# Patient Record
Sex: Male | Born: 1975 | Race: Black or African American | Hispanic: No | Marital: Single | Smoking: Never smoker
Health system: Southern US, Community
[De-identification: ages and names within clinical notes are randomized; demographics above are authoritative.]

## PROBLEM LIST (undated history)

## (undated) DIAGNOSIS — I10 Essential (primary) hypertension: Secondary | ICD-10-CM

## (undated) DIAGNOSIS — N189 Chronic kidney disease, unspecified: Secondary | ICD-10-CM

## (undated) DIAGNOSIS — E785 Hyperlipidemia, unspecified: Secondary | ICD-10-CM

## (undated) DIAGNOSIS — R112 Nausea with vomiting, unspecified: Secondary | ICD-10-CM

## (undated) DIAGNOSIS — Z9889 Other specified postprocedural states: Secondary | ICD-10-CM

## (undated) DIAGNOSIS — D649 Anemia, unspecified: Secondary | ICD-10-CM

## (undated) DIAGNOSIS — N289 Disorder of kidney and ureter, unspecified: Secondary | ICD-10-CM

## (undated) HISTORY — DX: Chronic kidney disease, unspecified: N18.9

## (undated) HISTORY — PX: NEPHRECTOMY TRANSPLANTED ORGAN: SUR880

## (undated) HISTORY — DX: Anemia, unspecified: D64.9

## (undated) HISTORY — DX: Hyperlipidemia, unspecified: E78.5

## (undated) HISTORY — DX: Essential (primary) hypertension: I10

## (undated) HISTORY — PX: WISDOM TOOTH EXTRACTION: SHX21

---

## 2008-11-12 ENCOUNTER — Encounter: Admission: RE | Admit: 2008-11-12 | Discharge: 2008-11-12 | Payer: Self-pay | Admitting: Nephrology

## 2010-09-09 ENCOUNTER — Encounter (HOSPITAL_COMMUNITY)
Admission: RE | Admit: 2010-09-09 | Discharge: 2010-12-08 | Payer: Self-pay | Source: Home / Self Care | Attending: Nephrology | Admitting: Nephrology

## 2011-12-20 ENCOUNTER — Ambulatory Visit (INDEPENDENT_AMBULATORY_CARE_PROVIDER_SITE_OTHER): Payer: Managed Care, Other (non HMO) | Admitting: Family Medicine

## 2011-12-20 VITALS — BP 112/82 | HR 72 | Temp 97.3°F | Resp 16 | Ht 70.0 in | Wt 205.0 lb

## 2011-12-20 DIAGNOSIS — N289 Disorder of kidney and ureter, unspecified: Secondary | ICD-10-CM

## 2011-12-20 DIAGNOSIS — R1110 Vomiting, unspecified: Secondary | ICD-10-CM

## 2011-12-20 DIAGNOSIS — R55 Syncope and collapse: Secondary | ICD-10-CM

## 2011-12-20 DIAGNOSIS — R197 Diarrhea, unspecified: Secondary | ICD-10-CM

## 2011-12-20 LAB — POCT CBC
Granulocyte percent: 45.8 %G (ref 37–80)
MID (cbc): 0.5 (ref 0–0.9)
MPV: 10.6 fL (ref 0–99.8)
POC LYMPH PERCENT: 42.5 %L (ref 10–50)
POC MID %: 11.7 %M (ref 0–12)
Platelet Count, POC: 187 10*3/uL (ref 142–424)
RBC: 5.02 M/uL (ref 4.69–6.13)
RDW, POC: 14.1 %

## 2011-12-20 LAB — POCT URINALYSIS DIPSTICK
Glucose, UA: NEGATIVE
Ketones, UA: NEGATIVE
Leukocytes, UA: NEGATIVE
Protein, UA: 30
Spec Grav, UA: 1.01
Urobilinogen, UA: 0.2

## 2011-12-20 LAB — BASIC METABOLIC PANEL
Chloride: 105 mEq/L (ref 96–112)
Creat: 6.09 mg/dL — ABNORMAL HIGH (ref 0.50–1.35)
Potassium: 5.2 mEq/L (ref 3.5–5.3)

## 2011-12-20 NOTE — Progress Notes (Signed)
Subjective:    Patient ID: SAYVION VIGEN, male    DOB: Dec 07, 1975, 37 y.o.   MRN: [id number]  HPI PRATIK DALZIEL is a 36 y.o. male with HTN, hyperlipidemia, and renal insufficiency (last Creatinine in the 3 range - Dr. Kathrene Bongo is nephrologist).  C/o 3 days ago - vomiting around 3pm, 4-5 episodes 1st day, cramping abd pain, and diarrhea>10 episodes. Vomiting slowed down - 2 episodes 2 days ago, none yesterday.  Diarrhea has persisted, slowed down. Diarrhea x 3-4 yesterday, x2 today.  Trouble w/ fluids initially, but drinking now.  Normal uop during illness - last episode 2 hrs ago. Lightheaded to syncope 2 days ago - cut on front of face, but no HA.  No prior eval.  Tx: peptobismol.  No fever.  Feeling better today,  No further abd pain, but needs doctors note for oow - 2/2 through today. Georganna Skeans.  Nonsmoker, no IDU, rare etoh - 1-2 drinks per week.    Review of Systems  Constitutional: Positive for appetite change and fatigue. Negative for fever, chills and unexpected weight change.  HENT: Negative for sore throat and trouble swallowing.   Respiratory: Negative for cough, choking and shortness of breath.   Cardiovascular: Negative for chest pain.  Gastrointestinal: Positive for nausea, vomiting, abdominal pain and diarrhea.       Diarrhea is only remaining symptom  Genitourinary: Negative for dysuria, urgency, frequency, hematuria, flank pain, decreased urine volume and difficulty urinating.  Skin: Negative for rash.  Neurological: Positive for dizziness, syncope and light-headedness. Negative for tremors, seizures, weakness and headaches.  Hematological: Does not bruise/bleed easily.       Objective:   Physical Exam  Constitutional: He is oriented to person, place, and time. He appears well-developed and well-nourished.  HENT:  Head: Normocephalic. Head is with abrasion. Head is without raccoon's eyes, without Battle's sign, without contusion, without right periorbital  erythema and without left periorbital erythema.    Right Ear: No hemotympanum.  Left Ear: No hemotympanum.  Nose: No mucosal edema or nasal septal hematoma.  Mouth/Throat: Uvula is midline, oropharynx is clear and moist and mucous membranes are normal.       Small superficial hemostatic abrasion - R eyebrow.  Eyes: Conjunctivae and EOM are normal. Pupils are equal, round, and reactive to light.  Neck: Normal range of motion. Neck supple.  Cardiovascular: Normal rate, regular rhythm, normal heart sounds and intact distal pulses.   No murmur heard. Pulmonary/Chest: Effort normal and breath sounds normal.  Abdominal: Soft. Bowel sounds are normal. He exhibits no distension. There is no hepatosplenomegaly. There is no tenderness. There is no rigidity, no rebound, no guarding and no CVA tenderness.  Neurological: He is alert and oriented to person, place, and time.  Skin: Skin is warm and dry. No rash noted.  Psychiatric: He has a normal mood and affect. His behavior is normal. Judgment and thought content normal.   Creatinines reviewed from nephrology office:  11/24/11: 3.93, 10/26/11: 4.2  Results for orders placed in visit on 12/20/11  POCT CBC      Component Value Range   WBC 4.2 (*) 4.6 - 10.2 (K/uL)   Lymph, poc 1.8  0.6 - 3.4    POC LYMPH PERCENT 42.5  10 - 50 (%L)   MID (cbc) 0.5  0 - 0.9    POC MID % 11.7  0 - 12 (%M)   POC Granulocyte 1.9 (*) 2 - 6.9    Granulocyte percent 45.8  37 -  80 (%G)   RBC 5.02  4.69 - 6.13 (M/uL)   Hemoglobin 13.0 (*) 14.1 - 18.1 (g/dL)   HCT, POC 52.8 (*) 41.3 - 53.7 (%)   MCV 81.8  80 - 97 (fL)   MCH, POC 25.9 (*) 27 - 31.2 (pg)   MCHC 31.6 (*) 31.8 - 35.4 (g/dL)   RDW, POC 24.4     Platelet Count, POC 187  142 - 424 (K/uL)   MPV 10.6  0 - 99.8 (fL)  POCT URINALYSIS DIPSTICK      Component Value Range   Color, UA yellow     Clarity, UA clear     Glucose, UA neg     Bilirubin, UA neg     Ketones, UA neg     Spec Grav, UA 1.010     Blood, UA  trace     pH, UA 5.0     Protein, UA 30     Urobilinogen, UA 0.2     Nitrite, UA neg     Leukocytes, UA Negative          Assessment & Plan:   1. Diarrhea  POCT CBC, Basic metabolic panel, POCT urinalysis dipstick  2. Vomiting  POCT CBC, Basic metabolic panel, POCT urinalysis dipstick  3. Kidney disease  Basic metabolic panel   Likely viral gastroenteritis, now improved.  And tolerating po fluids.  Initial vital signs repeated are stable. Suspect syncopal episode 2 days ago d/t combo of volume depletion adn antihypertensives.  Underlying CKD - at risk for worsening with pre renal azotemia - stat BMP sent tonight.  As he feels ok now, denies recent dizziness, and BP ok, can try to RTW with orthostatic precautions, and avoid working on elevated surfaces.  Push fluids, and check BP 2x/day.  If any worsening of sx's, change in urination, or return of abd pain/vomiting - Return to the clinic or go to the nearest emergency room.  Pt's cell phone for stat labs:  [phone]

## 2011-12-20 NOTE — Patient Instructions (Addendum)
Push fluids, and check Blood Pressures 2x/day.  If any worsening of symptoms, change in urination, or return of abdominal pain or vomiting - Return to the clinic or go to the nearest emergency room.  Do not work on elevated surfaces tonight, and if any dizziness, stop working and return to clinic.  If diarrhea not resolving in next 2-3 days, return to clinic.

## 2011-12-24 ENCOUNTER — Telehealth: Payer: Self-pay

## 2011-12-24 NOTE — Telephone Encounter (Signed)
NORA @ [facility] WANTS A COPY OF DR GREENE'S NOTES RU:[id number] IN BP MEDICATION FOR PT.  PLEASE FAX TO [phone]

## 2011-12-27 ENCOUNTER — Telehealth: Payer: Self-pay

## 2012-01-12 ENCOUNTER — Telehealth: Payer: Self-pay

## 2012-01-12 NOTE — Telephone Encounter (Signed)
.  [facility] Patient called regarding medication issue.  Patient states Dr. Neva Seat discontinued one of his blood pressure medications and now his blood pressure is elevated. Patient would like to know if he needs to restart that medication or if this is a normal side effect and will stop with time. Please call patient with instructions. Patient phone number is [phone].

## 2012-01-14 NOTE — Telephone Encounter (Signed)
LMOM to CB. 

## 2012-01-15 NOTE — Telephone Encounter (Signed)
LMOM TO CB 

## 2012-01-16 NOTE — Telephone Encounter (Signed)
LMOM TO CB 

## 2012-01-17 NOTE — Telephone Encounter (Signed)
Pt reports that his BP is now good again. He had gone to his nephrologist who had changed his medications around a little and everything is fine now.

## 2012-02-18 NOTE — Telephone Encounter (Signed)
Please see note from 2/27 for details.  Ryan

## 2012-08-10 ENCOUNTER — Ambulatory Visit (INDEPENDENT_AMBULATORY_CARE_PROVIDER_SITE_OTHER): Payer: Managed Care, Other (non HMO) | Admitting: Family Medicine

## 2012-08-10 VITALS — BP 130/89 | HR 63 | Temp 97.3°F | Resp 18 | Ht 70.0 in | Wt 208.0 lb

## 2012-08-10 DIAGNOSIS — Z0000 Encounter for general adult medical examination without abnormal findings: Secondary | ICD-10-CM

## 2012-08-10 NOTE — Progress Notes (Signed)
[facility]:  Office Visit  Chief Complaint:  Chief Complaint  Patient presents with  . Employment Physical    biometric review and paperwork    HPI: Lawrence Mckenzie is a 36 y.o. male who complains of  PE for work. He already had lipids and glucose done at work on 03/31/12 and they were all normal. He declines blood work today. H/o HTN and CKD with baseline creatinine around 6. Patient on transplant list. CKD due to HTN he says.   Past Medical History  Diagnosis Date  . Chronic kidney disease   . Hypertension   . Hyperlipidemia    History reviewed. No pertinent past surgical history. History   Social History  . Marital Status: Single    Spouse Name: N/A    Number of Children: N/A  . Years of Education: N/A   Social History Main Topics  . Smoking status: Never Smoker   . Smokeless tobacco: None  . Alcohol Use: Yes  . Drug Use: No  . Sexually Active: None   Other Topics Concern  . None   Social History Narrative  . None   Family History  Problem Relation Age of Onset  . Hypertension Mother   . Hypertension Father    No Known Allergies Prior to Admission medications   Medication Sig Start Date End Date Taking? Authorizing Provider  amLODipine (NORVASC) 5 MG tablet Take 5 mg by mouth daily.   Yes Historical Provider, MD  Choline Fenofibrate (TRILIPIX) 135 MG capsule Take 135 mg by mouth daily.   Yes Historical Provider, MD  lisinopril-hydrochlorothiazide (PRINZIDE,ZESTORETIC) 20-12.5 MG per tablet Take 1 tablet by mouth daily.   Yes Historical Provider, MD  metoprolol succinate (TOPROL-XL) 50 MG 24 hr tablet Take 50 mg by mouth daily. Take with or immediately following a meal.   Yes Historical Provider, MD     ROS: The patient denies fevers, chills, night sweats, unintentional weight loss, chest pain, palpitations, wheezing, dyspnea on exertion, nausea, vomiting, abdominal pain, dysuria, hematuria, melena, numbness, weakness, or tingling.    All other systems have been reviewed and were otherwise negative with the exception of those mentioned in the HPI and as above.    PHYSICAL EXAM: Filed Vitals:   08/10/12 1653  BP: 130/89  Pulse: 63  Temp: 97.3 F (36.3 C)  Resp: 18   Filed Vitals:   08/10/12 1653  Height: 5\' 10"  (1.778 m)  Weight: 208 lb (94.348 kg)   Body mass index is 29.84 kg/(m^2).  General: Alert, no acute distress HEENT:  Normocephalic, atraumatic, oropharynx patent. Fundoscopic exam grossly nl. Cardiovascular:  Regular rate and rhythm, no rubs murmurs or gallops.  No Carotid bruits, radial pulse intact. No pedal edema.  Respiratory: Clear to auscultation bilaterally.  No wheezes, rales, or rhonchi.  No cyanosis, no use of accessory musculature GI: No organomegaly, abdomen is soft and non-tender, positive bowel sounds.  No masses. Skin: No rashes. Neurologic: Facial musculature symmetric. CN 2-12 grossly nl Psychiatric: Patient is appropriate throughout our interaction. Lymphatic: No cervical lymphadenopathy Musculoskeletal: Gait intact.   LABS: Results for orders placed in visit on 12/20/11  POCT CBC      Component Value Range   WBC 4.2 (*) 4.6 - 10.2 K/uL   Lymph, poc 1.8  0.6 - 3.4   POC LYMPH PERCENT 42.5  10 - 50 %L   MID (cbc) 0.5  0 - 0.9   POC MID % 11.7  0 - 12 %M  POC Granulocyte 1.9 (*) 2 - 6.9   Granulocyte percent 45.8  37 - 80 %G   RBC 5.02  4.69 - 6.13 M/uL   Hemoglobin 13.0 (*) 14.1 - 18.1 g/dL   HCT, POC 82.9 (*) 56.2 - 53.7 %   MCV 81.8  80 - 97 fL   MCH, POC 25.9 (*) 27 - 31.2 pg   MCHC 31.6 (*) 31.8 - 35.4 g/dL   RDW, POC 13.0     Platelet Count, POC 187  142 - 424 K/uL   MPV 10.6  0 - 99.8 fL  BASIC METABOLIC PANEL      Component Value Range   Sodium 135  135 - 145 mEq/L   Potassium 5.2  3.5 - 5.3 mEq/L   Chloride 105  96 - 112 mEq/L   CO2 21  19 - 32 mEq/L   Glucose, Bld 92  70 - 99 mg/dL   BUN 77 (*) 6 - 23 mg/dL   Creat 8.65 (*) 7.84 - 1.35 mg/dL   Calcium  9.4  8.4 - 69.6 mg/dL  POCT URINALYSIS DIPSTICK      Component Value Range   Color, UA yellow     Clarity, UA clear     Glucose, UA neg     Bilirubin, UA neg     Ketones, UA neg     Spec Grav, UA 1.010     Blood, UA trace     pH, UA 5.0     Protein, UA 30     Urobilinogen, UA 0.2     Nitrite, UA neg     Leukocytes, UA Negative       EKG/XRAY:   Primary read interpreted by Dr. Conley Rolls at [facility].   ASSESSMENT/PLAN: Encounter Diagnosis  Name Primary?  . PE (physical exam), annual Yes   Forms filled out F/u prn    LE, THAO PHUONG, DO 08/10/2012 5:34 PM

## 2012-11-29 ENCOUNTER — Other Ambulatory Visit (HOSPITAL_COMMUNITY): Payer: Self-pay | Admitting: *Deleted

## 2012-11-30 ENCOUNTER — Encounter (HOSPITAL_COMMUNITY): Payer: Self-pay

## 2012-12-07 ENCOUNTER — Encounter (HOSPITAL_COMMUNITY): Payer: Self-pay

## 2013-01-10 ENCOUNTER — Encounter (HOSPITAL_COMMUNITY): Payer: Self-pay

## 2013-01-12 ENCOUNTER — Telehealth: Payer: Self-pay | Admitting: Radiology

## 2013-01-12 NOTE — Telephone Encounter (Signed)
Patient has had IV infusions of Iron. He wanted this done through our office if possible to save money. He is advised we do not do iron infusions. He will continue to try to find a provider who can do this for him.

## 2013-01-16 ENCOUNTER — Encounter (HOSPITAL_COMMUNITY): Payer: Self-pay

## 2013-02-28 ENCOUNTER — Encounter: Payer: Self-pay | Admitting: Family Medicine

## 2013-02-28 DIAGNOSIS — R809 Proteinuria, unspecified: Secondary | ICD-10-CM | POA: Insufficient documentation

## 2013-02-28 DIAGNOSIS — I10 Essential (primary) hypertension: Secondary | ICD-10-CM

## 2013-02-28 DIAGNOSIS — R319 Hematuria, unspecified: Secondary | ICD-10-CM | POA: Insufficient documentation

## 2013-06-29 ENCOUNTER — Ambulatory Visit (INDEPENDENT_AMBULATORY_CARE_PROVIDER_SITE_OTHER): Payer: Managed Care, Other (non HMO) | Admitting: Internal Medicine

## 2013-06-29 VITALS — BP 122/84 | HR 57 | Temp 98.7°F | Resp 18 | Ht 69.5 in | Wt 198.0 lb

## 2013-06-29 DIAGNOSIS — K529 Noninfective gastroenteritis and colitis, unspecified: Secondary | ICD-10-CM

## 2013-06-29 DIAGNOSIS — K5289 Other specified noninfective gastroenteritis and colitis: Secondary | ICD-10-CM

## 2013-06-29 NOTE — Patient Instructions (Addendum)
Viral Gastroenteritis Viral gastroenteritis is also known as stomach flu. This condition affects the stomach and intestinal tract. It can cause sudden diarrhea and vomiting. The illness typically lasts 3 to 8 days. Most people develop an immune response that eventually gets rid of the virus. While this natural response develops, the virus can make you quite ill. CAUSES  Many different viruses can cause gastroenteritis, such as rotavirus or noroviruses. You can catch one of these viruses by consuming contaminated food or water. You may also catch a virus by sharing utensils or other personal items with an infected person or by touching a contaminated surface. SYMPTOMS  The most common symptoms are diarrhea and vomiting. These problems can cause a severe loss of body fluids (dehydration) and a body salt (electrolyte) imbalance. Other symptoms may include:  Fever.  Headache.  Fatigue.  Abdominal pain. DIAGNOSIS  Your caregiver can usually diagnose viral gastroenteritis based on your symptoms and a physical exam. A stool sample may also be taken to test for the presence of viruses or other infections. TREATMENT  This illness typically goes away on its own. Treatments are aimed at rehydration. The most serious cases of viral gastroenteritis involve vomiting so severely that you are not able to keep fluids down. In these cases, fluids must be given through an intravenous line (IV). HOME CARE INSTRUCTIONS   Drink enough fluids to keep your urine clear or pale yellow. Drink small amounts of fluids frequently and increase the amounts as tolerated.  Ask your caregiver for specific rehydration instructions.  Avoid:  Foods high in sugar.  Alcohol.  Carbonated drinks.  Tobacco.  Juice.  Caffeine drinks.  Extremely hot or cold fluids.  Fatty, greasy foods.  Too much intake of anything at one time.  Dairy products until 24 to 48 hours after diarrhea stops.  You may consume probiotics.  Probiotics are active cultures of beneficial bacteria. They may lessen the amount and number of diarrheal stools in adults. Probiotics can be found in yogurt with active cultures and in supplements.  Wash your hands well to avoid spreading the virus.  Only take over-the-counter or prescription medicines for pain, discomfort, or fever as directed by your caregiver. Do not give aspirin to children. Antidiarrheal medicines are not recommended.  Ask your caregiver if you should continue to take your regular prescribed and over-the-counter medicines.  Keep all follow-up appointments as directed by your caregiver. SEEK IMMEDIATE MEDICAL CARE IF:   You are unable to keep fluids down.  You do not urinate at least once every 6 to 8 hours.  You develop shortness of breath.  You notice blood in your stool or vomit. This may look like coffee grounds.  You have abdominal pain that increases or is concentrated in one small area (localized).  You have persistent vomiting or diarrhea.  You have a fever.  The patient is a child younger than 3 months, and he or she has a fever.  The patient is a child older than 3 months, and he or she has a fever and persistent symptoms.  The patient is a child older than 3 months, and he or she has a fever and symptoms suddenly get worse.  The patient is a baby, and he or she has no tears when crying. MAKE SURE YOU:   Understand these instructions.  Will watch your condition.  Will get help right away if you are not doing well or get worse. Document Released: 11/01/2005 Document Revised: 01/24/2012 Document Reviewed: 08/18/2011   Exit[employer] Patient Information 2014 [employer]  

## 2013-06-29 NOTE — Progress Notes (Signed)
  Subjective:    Patient ID: Lawrence Mckenzie, male    DOB: 05-17-1976, 37 y.o.   MRN: [id number]  HPI Diarrhea improving No fever, vomiting,or abdominal pain.  Review of Systems     Objective:   Physical Exam  Vitals reviewed. Constitutional: He is oriented to person, place, and time. He appears well-developed and well-nourished. No distress.  HENT:  Right Ear: External ear normal.  Left Ear: External ear normal.  Nose: Nose normal.  Mouth/Throat: Oropharynx is clear and moist.  Eyes: EOM are normal. No scleral icterus.  Neck: Neck supple.  Cardiovascular: Normal rate.   Pulmonary/Chest: Effort normal.  Abdominal: Soft. There is no tenderness.  Musculoskeletal: Normal range of motion.  Neurological: He is alert and oriented to person, place, and time. He exhibits normal muscle tone. Coordination normal.          Assessment & Plan:  Gastroenteritis Diet/Hydrate/Rest

## 2013-07-20 ENCOUNTER — Other Ambulatory Visit (HOSPITAL_COMMUNITY): Payer: Self-pay

## 2013-07-23 ENCOUNTER — Encounter (HOSPITAL_COMMUNITY)
Admission: RE | Admit: 2013-07-23 | Discharge: 2013-07-23 | Disposition: A | Discharge status: Home / Self Care | Payer: Managed Care, Other (non HMO) | Source: Ambulatory Visit | Attending: Nephrology | Admitting: Nephrology

## 2013-07-23 DIAGNOSIS — D649 Anemia, unspecified: Secondary | ICD-10-CM | POA: Insufficient documentation

## 2013-07-23 LAB — POCT HEMOGLOBIN-HEMACUE: Hemoglobin: 9.3 g/dL — ABNORMAL LOW (ref 13.0–17.0)

## 2013-07-23 MED ORDER — EPOETIN ALFA 20000 UNIT/ML IJ SOLN: 20000.0000 [IU] | INTRAMUSCULAR | Status: DC

## 2013-07-23 MED ORDER — EPOETIN ALFA 20000 UNIT/ML IJ SOLN
INTRAMUSCULAR | Status: AC
  Administered 2013-07-23: 20000 [IU]
  Filled 2013-07-23: qty 1

## 2013-07-23 MED ORDER — SODIUM CHLORIDE 0.9 % IV SOLN
1020.0000 mg | Freq: Once | INTRAVENOUS | Status: AC
  Administered 2013-07-23: 1020 mg via INTRAVENOUS
  Filled 2013-07-23: qty 34

## 2013-08-06 ENCOUNTER — Encounter (HOSPITAL_COMMUNITY)
Admission: RE | Admit: 2013-08-06 | Discharge: 2013-08-06 | Disposition: A | Discharge status: Home / Self Care | Payer: Managed Care, Other (non HMO) | Source: Ambulatory Visit | Attending: Nephrology | Admitting: Nephrology

## 2013-08-06 LAB — POCT HEMOGLOBIN-HEMACUE: Hemoglobin: 10.5 g/dL — ABNORMAL LOW (ref 13.0–17.0)

## 2013-08-06 MED ORDER — EPOETIN ALFA 20000 UNIT/ML IJ SOLN
INTRAMUSCULAR | Status: AC
  Filled 2013-08-06: qty 1

## 2013-08-06 MED ORDER — EPOETIN ALFA 20000 UNIT/ML IJ SOLN
20000.0000 [IU] | INTRAMUSCULAR | Status: DC
  Administered 2013-08-06: 20000 [IU] via SUBCUTANEOUS

## 2013-08-12 ENCOUNTER — Ambulatory Visit (INDEPENDENT_AMBULATORY_CARE_PROVIDER_SITE_OTHER): Payer: Managed Care, Other (non HMO) | Admitting: Emergency Medicine

## 2013-08-12 VITALS — BP 138/88 | HR 82 | Temp 98.2°F | Resp 18 | Ht 69.25 in | Wt 196.4 lb

## 2013-08-12 DIAGNOSIS — E785 Hyperlipidemia, unspecified: Secondary | ICD-10-CM

## 2013-08-12 DIAGNOSIS — I10 Essential (primary) hypertension: Secondary | ICD-10-CM

## 2013-08-12 DIAGNOSIS — Z0000 Encounter for general adult medical examination without abnormal findings: Secondary | ICD-10-CM

## 2013-08-12 DIAGNOSIS — N289 Disorder of kidney and ureter, unspecified: Secondary | ICD-10-CM

## 2013-08-12 LAB — POCT UA - MICROSCOPIC ONLY
Bacteria, U Microscopic: NEGATIVE
Crystals, Ur, HPF, POC: NEGATIVE
Epithelial cells, urine per micros: NEGATIVE
Mucus, UA: NEGATIVE
RBC, urine, microscopic: NEGATIVE
WBC, Ur, HPF, POC: NEGATIVE

## 2013-08-12 LAB — POCT CBC
Granulocyte percent: 50.1 %G (ref 37–80)
HCT, POC: 38.1 % — AB (ref 43.5–53.7)
Hemoglobin: 11.6 g/dL — AB (ref 14.1–18.1)
MCV: 89.4 fL (ref 80–97)
POC Granulocyte: 2.7 (ref 2–6.9)
POC LYMPH PERCENT: 41.7 %L (ref 10–50)
RBC: 4.26 M/uL — AB (ref 4.69–6.13)
RDW, POC: 15.8 %

## 2013-08-12 LAB — LIPID PANEL
HDL: 45 mg/dL (ref >39)
Total CHOL/HDL Ratio: 2.8 Ratio
VLDL: 20 mg/dL (ref 0–40)

## 2013-08-12 LAB — POCT URINALYSIS DIPSTICK
Bilirubin, UA: NEGATIVE
Blood, UA: NEGATIVE
Ketones, UA: NEGATIVE
Leukocytes, UA: NEGATIVE
Protein, UA: 30
Spec Grav, UA: 1.01
pH, UA: 5.5

## 2013-08-12 LAB — COMPREHENSIVE METABOLIC PANEL
AST: 26 U/L (ref 0–37)
BUN: 92 mg/dL — ABNORMAL HIGH (ref 6–23)
Calcium: 9 mg/dL (ref 8.4–10.5)
Chloride: 102 mEq/L (ref 96–112)
Creat: 8.13 mg/dL — ABNORMAL HIGH (ref 0.50–1.35)
Total Bilirubin: 0.4 mg/dL (ref 0.3–1.2)

## 2013-08-12 LAB — TSH: TSH: 1.03 u[IU]/mL (ref 0.350–4.500)

## 2013-08-12 NOTE — Patient Instructions (Signed)

## 2013-08-12 NOTE — Progress Notes (Signed)
[facility] [address] [place] [address] [phone]  Date:  08/12/2013   Name:  Lawrence Mckenzie   DOB:  08/02/1976   MRN:  [id number]  PCP:  No PCP Per Patient    Chief Complaint: Annual Exam   History of Present Illness:  Lawrence Mckenzie is a 37 y.o. very pleasant male patient who presents with the following:  History of hypertension and hyperlipidemia and chronic renal insufficiency.  To office for wellness physical.  Non smoker.    Patient Active Problem List   Diagnosis Date Noted  . 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 Proteinuria 02/28/2013  . Hematuria 02/28/2013    Past Medical History  Diagnosis Date  . Chronic kidney disease   . Hypertension   . Hyperlipidemia     History reviewed. No pertinent past surgical history.  History  Substance Use Topics  . Smoking status: Never Smoker   . Smokeless tobacco: Not on file  . Alcohol Use: No    Family History  Problem Relation Age of Onset  . Hypertension Mother   . Hypertension Father     No Known Allergies  Medication list has been reviewed and updated.  Current Outpatient Prescriptions on File Prior to Visit  Medication Sig Dispense Refill  . amLODipine (NORVASC) 5 MG tablet Take 5 mg by mouth daily.      . Choline Fenofibrate (TRILIPIX) 135 MG capsule Take 135 mg by mouth daily.      . metoprolol succinate (TOPROL-XL) 50 MG 24 hr tablet Take 50 mg by mouth daily. Take with or immediately following a meal.       No current facility-administered medications on file prior to visit.    Review of Systems:  As per HPI, otherwise negative.    Physical Examination: Filed Vitals:   08/12/13 1446  BP: 138/88  Pulse: 82  Temp: 98.2 F (36.8 C)  Resp: 18   Filed Vitals:   08/12/13 1446  Height: 5' 9.25" (1.759 m)  Weight: 196 lb 6.4 oz (89.086 kg)   Body mass index is 28.79 kg/(m^2). Ideal Body Weight: Weight in (lb) to have BMI = 25: 170.2   GEN: WDWN, NAD,  Non-toxic, A & O x 3 HEENT: Atraumatic, Normocephalic. Neck supple. No masses, No LAD. Ears and Nose: No external deformity. CV: RRR, No M/G/R. No JVD. No thrill. No extra heart sounds. PULM: CTA B, no wheezes, crackles, rhonchi. No retractions. No resp. distress. No accessory muscle use. ABD: S, NT, ND, +BS. No rebound. No HSM. EXTR: No c/c/e NEURO Normal gait.  PSYCH: Normally interactive. Conversant. Not depressed or anxious appearing.  Calm demeanor.    Assessment and Plan: Hypertension Hyperlipidemia Chronic renal insufficiency Labs  Signed,  Phillips Odor, MD

## 2013-08-13 ENCOUNTER — Telehealth: Payer: Self-pay | Admitting: Family Medicine

## 2013-08-13 LAB — VITAMIN D 25 HYDROXY (VIT D DEFICIENCY, FRACTURES): Vit D, 25-Hydroxy: 14 ng/mL — ABNORMAL LOW (ref 30–89)

## 2013-08-13 NOTE — Telephone Encounter (Addendum)
Received critical lab from [facility]- creat over 8. Called pt-apparently this is his baseline. [phone] Dr. Kathrene Bongo is his nephrologist.  Faxed labs to her- when we called their office they reported that pt is actually there right now.

## 2013-08-14 ENCOUNTER — Telehealth: Payer: Self-pay

## 2013-08-14 MED ORDER — VITAMIN D (ERGOCALCIFEROL) 1.25 MG (50000 UNIT) PO CAPS: 50000.0000 [IU] | ORAL_CAPSULE | ORAL | Status: DC

## 2013-08-14 NOTE — Telephone Encounter (Signed)
Called- he is doing well, sees his nephrologist once a month.  He was at her office yesterday

## 2013-08-14 NOTE — Addendum Note (Signed)
Addended by: Carmelina Dane on: 08/14/2013 08:53 AM   Modules accepted: Orders

## 2013-08-14 NOTE — Telephone Encounter (Signed)
Pt is returning the labs call

## 2013-08-20 ENCOUNTER — Encounter (HOSPITAL_COMMUNITY)
Admission: RE | Admit: 2013-08-20 | Discharge: 2013-08-20 | Disposition: A | Discharge status: Home / Self Care | Payer: Managed Care, Other (non HMO) | Source: Ambulatory Visit | Attending: Nephrology | Admitting: Nephrology

## 2013-08-20 DIAGNOSIS — D649 Anemia, unspecified: Secondary | ICD-10-CM | POA: Insufficient documentation

## 2013-08-20 LAB — IRON AND TIBC
Iron: 172 ug/dL — ABNORMAL HIGH (ref 42–135)
TIBC: 351 ug/dL (ref 215–435)

## 2013-08-20 LAB — FERRITIN: Ferritin: 1334 ng/mL — ABNORMAL HIGH (ref 22–322)

## 2013-08-20 LAB — POCT HEMOGLOBIN-HEMACUE: Hemoglobin: 10.8 g/dL — ABNORMAL LOW (ref 13.0–17.0)

## 2013-08-20 MED ORDER — EPOETIN ALFA 20000 UNIT/ML IJ SOLN
INTRAMUSCULAR | Status: AC
  Filled 2013-08-20: qty 1

## 2013-08-20 MED ORDER — EPOETIN ALFA 20000 UNIT/ML IJ SOLN
20000.0000 [IU] | INTRAMUSCULAR | Status: DC
  Administered 2013-08-20: 20000 [IU] via SUBCUTANEOUS

## 2013-08-30 ENCOUNTER — Encounter: Payer: Self-pay | Admitting: Family Medicine

## 2013-08-30 DIAGNOSIS — N184 Chronic kidney disease, stage 4 (severe): Secondary | ICD-10-CM | POA: Insufficient documentation

## 2013-09-03 ENCOUNTER — Encounter (HOSPITAL_COMMUNITY): Payer: Managed Care, Other (non HMO)

## 2013-09-14 ENCOUNTER — Other Ambulatory Visit (HOSPITAL_COMMUNITY): Payer: Self-pay | Admitting: *Deleted

## 2013-09-17 ENCOUNTER — Encounter (HOSPITAL_COMMUNITY)
Admission: RE | Admit: 2013-09-17 | Discharge: 2013-09-17 | Disposition: A | Discharge status: Home / Self Care | Payer: Managed Care, Other (non HMO) | Source: Ambulatory Visit | Attending: Nephrology | Admitting: Nephrology

## 2013-09-17 DIAGNOSIS — D649 Anemia, unspecified: Secondary | ICD-10-CM | POA: Insufficient documentation

## 2013-09-17 LAB — IRON AND TIBC
Saturation Ratios: 50 % (ref 20–55)
TIBC: 353 ug/dL (ref 215–435)
UIBC: 177 ug/dL (ref 125–400)

## 2013-09-17 MED ORDER — EPOETIN ALFA 20000 UNIT/ML IJ SOLN
INTRAMUSCULAR | Status: AC
  Filled 2013-09-17: qty 1

## 2013-09-17 MED ORDER — EPOETIN ALFA 20000 UNIT/ML IJ SOLN
20000.0000 [IU] | INTRAMUSCULAR | Status: DC
  Administered 2013-09-17: 20000 [IU] via SUBCUTANEOUS

## 2013-10-02 ENCOUNTER — Ambulatory Visit (INDEPENDENT_AMBULATORY_CARE_PROVIDER_SITE_OTHER): Payer: Managed Care, Other (non HMO) | Admitting: Family Medicine

## 2013-10-02 VITALS — BP 118/80 | HR 65 | Temp 98.3°F | Resp 18 | Wt 205.0 lb

## 2013-10-02 DIAGNOSIS — K5289 Other specified noninfective gastroenteritis and colitis: Secondary | ICD-10-CM

## 2013-10-02 DIAGNOSIS — K529 Noninfective gastroenteritis and colitis, unspecified: Secondary | ICD-10-CM

## 2013-10-02 NOTE — Progress Notes (Signed)
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 He's had no blood in the stool and has minimal cramping. He's missed work for 2 days and needs a work note. He's had some cough but the cough is almost resolved at this point. He's had no fever or significant abdominal pain.  Objective: Patient seen with his wife and 41-month-old son. He is in no acute distress. HEENT: Unremarkable Abdomen: Soft nontender without HSM. Bowel sounds normal Chest: Clear Heart: Regular no murmur, normal rate Extremities: No jaundice, normal gait, no edema  Assessment: Viral gastroenteritis, hemodynamically stable  Plan: Probiotics, return to work tomorrow  No diagnosis found.  Signed, Elvina Sidle, MD

## 2013-10-15 ENCOUNTER — Encounter (HOSPITAL_COMMUNITY)
Admission: RE | Admit: 2013-10-15 | Discharge: 2013-10-15 | Disposition: A | Discharge status: Home / Self Care | Payer: Managed Care, Other (non HMO) | Source: Ambulatory Visit | Attending: Nephrology | Admitting: Nephrology

## 2013-10-15 DIAGNOSIS — D649 Anemia, unspecified: Secondary | ICD-10-CM | POA: Insufficient documentation

## 2013-10-15 LAB — FERRITIN: Ferritin: 783 ng/mL — ABNORMAL HIGH (ref 22–322)

## 2013-10-15 LAB — IRON AND TIBC
Iron: 159 ug/dL — ABNORMAL HIGH (ref 42–135)
Saturation Ratios: 47 % (ref 20–55)
UIBC: 180 ug/dL (ref 125–400)

## 2013-10-15 LAB — POCT HEMOGLOBIN-HEMACUE: Hemoglobin: 10.6 g/dL — ABNORMAL LOW (ref 13.0–17.0)

## 2013-10-15 MED ORDER — EPOETIN ALFA 20000 UNIT/ML IJ SOLN
20000.0000 [IU] | INTRAMUSCULAR | Status: DC
  Administered 2013-10-15: 20000 [IU] via SUBCUTANEOUS

## 2013-10-15 MED ORDER — EPOETIN ALFA 20000 UNIT/ML IJ SOLN
INTRAMUSCULAR | Status: AC
  Filled 2013-10-15: qty 1

## 2013-11-12 ENCOUNTER — Encounter (HOSPITAL_COMMUNITY)
Admission: RE | Admit: 2013-11-12 | Discharge: 2013-11-12 | Disposition: A | Discharge status: Home / Self Care | Payer: Managed Care, Other (non HMO) | Source: Ambulatory Visit | Attending: Nephrology | Admitting: Nephrology

## 2013-11-12 LAB — POCT HEMOGLOBIN-HEMACUE: Hemoglobin: 10.3 g/dL — ABNORMAL LOW (ref 13.0–17.0)

## 2013-11-12 MED ORDER — EPOETIN ALFA 20000 UNIT/ML IJ SOLN
20000.0000 [IU] | INTRAMUSCULAR | Status: DC
  Administered 2013-11-12: 20000 [IU] via SUBCUTANEOUS

## 2013-11-12 MED ORDER — EPOETIN ALFA 20000 UNIT/ML IJ SOLN
INTRAMUSCULAR | Status: AC
  Administered 2013-11-12: 20000 [IU] via SUBCUTANEOUS
  Filled 2013-11-12: qty 1

## 2013-12-07 ENCOUNTER — Other Ambulatory Visit (HOSPITAL_COMMUNITY): Payer: Self-pay

## 2013-12-10 ENCOUNTER — Encounter (HOSPITAL_COMMUNITY): Payer: Managed Care, Other (non HMO)

## 2013-12-11 ENCOUNTER — Ambulatory Visit (INDEPENDENT_AMBULATORY_CARE_PROVIDER_SITE_OTHER): Payer: Managed Care, Other (non HMO) | Admitting: General Surgery

## 2013-12-11 ENCOUNTER — Encounter (INDEPENDENT_AMBULATORY_CARE_PROVIDER_SITE_OTHER): Payer: Self-pay | Admitting: General Surgery

## 2013-12-11 VITALS — BP 118/74 | HR 86 | Resp 16 | Ht 70.0 in | Wt 210.0 lb

## 2013-12-11 DIAGNOSIS — N189 Chronic kidney disease, unspecified: Secondary | ICD-10-CM

## 2013-12-11 NOTE — Progress Notes (Signed)
Patient ID: Lawrence LeaMichael Kolek, male   DOB: 06-28-1976, 38 y.o.   MRN: [id number]  Chief Complaint  Patient presents with  . Other    Eval PD Cath    HPI Lawrence Mckenzie is a 38 y.o. male.  The patient is a 38 year old male who is referred by Dr. Kathrene BongoG[place] for an evaluation for a PD catheter placement. Patient has a history of chronic renal failure of unknown etiology. Patient does have a history of hypertension. Patient is currently not on dialysis at this time but according to Dr. Kathrene BongoG[place] is close to needing dialysis at this time. The patient is on a kidney transplant list and has been on it for about 2-4 years. Patient comes in today for initial consultation for a PD catheter placement. HPI  Past Medical History  Diagnosis Date  . Chronic kidney disease   . Hypertension   . Hyperlipidemia   . Anemia     History reviewed. No pertinent past surgical history.  Family History  Problem Relation Age of Onset  . Hypertension Mother   . Hypertension Father     Social History History  Substance Use Topics  . Smoking status: Never Smoker   . Smokeless tobacco: Not on file  . Alcohol Use: No    No Known Allergies  Current Outpatient Prescriptions  Medication Sig Dispense Refill  . amLODipine (NORVASC) 5 MG tablet Take 5 mg by mouth daily.      . Choline Fenofibrate (TRILIPIX) 135 MG capsule Take 135 mg by mouth daily.      . metoprolol succinate (TOPROL-XL) 50 MG 24 hr tablet Take 50 mg by mouth daily. Take with or immediately following a meal.      . Vitamin D, Ergocalciferol, (DRISDOL) 50000 UNITS CAPS capsule Take 1 capsule (50,000 Units total) by mouth every 7 (seven) days.  30 capsule  4   No current facility-administered medications for this visit.    Review of Systems Review of Systems  Constitutional: Negative.   HENT: Negative.   Eyes: Negative.   Respiratory: Negative.   Cardiovascular: Negative.   Gastrointestinal: Negative.   Endocrine: Negative.    Neurological: Negative.     Blood pressure 118/74, pulse 86, resp. rate 16, height 5\' 10"  (1.778 m), weight 210 lb (95.255 kg).  Physical Exam Physical Exam  Constitutional: He is oriented to person, place, and time. He appears well-developed and well-nourished.  HENT:  Head: Normocephalic and atraumatic.  Eyes: Conjunctivae and EOM are normal. Pupils are equal, round, and reactive to light.  Neck: Normal range of motion. Neck supple.  Cardiovascular: Normal rate, regular rhythm and normal heart sounds.   Pulmonary/Chest: Effort normal and breath sounds normal.  Abdominal: Soft. Bowel sounds are normal. He exhibits no distension and no mass. There is no tenderness. There is no rebound and no guarding.  Musculoskeletal: Normal range of motion.  Neurological: He is alert and oriented to person, place, and time.  Skin: Skin is warm and dry.    Data Reviewed none  Assessment    38 year old male with chronic kidney failure and likely need for dialysis access.     Plan    1. Patient like to discuss further with dialysis nurses task more questions prior deciding at scheduling surgery. 2. Patient can cause back to schedule once he has definitely decided with the catheter placement.        Marigene Ehlersamirez Jr., Kehinde Totzke 12/11/2013, 9:57 AM

## 2013-12-17 ENCOUNTER — Ambulatory Visit (INDEPENDENT_AMBULATORY_CARE_PROVIDER_SITE_OTHER): Payer: Self-pay | Admitting: General Surgery

## 2014-01-08 ENCOUNTER — Other Ambulatory Visit (INDEPENDENT_AMBULATORY_CARE_PROVIDER_SITE_OTHER): Payer: Self-pay | Admitting: General Surgery

## 2014-01-16 ENCOUNTER — Encounter (HOSPITAL_COMMUNITY): Payer: Self-pay | Admitting: Pharmacy Technician

## 2014-01-18 ENCOUNTER — Encounter (INDEPENDENT_AMBULATORY_CARE_PROVIDER_SITE_OTHER): Payer: Self-pay

## 2014-01-18 ENCOUNTER — Telehealth (INDEPENDENT_AMBULATORY_CARE_PROVIDER_SITE_OTHER): Payer: Self-pay

## 2014-01-18 NOTE — Telephone Encounter (Signed)
LMOM for pt to p/u RTW note at front desk.

## 2014-01-24 NOTE — Pre-Procedure Instructions (Addendum)
Lawrence LeaMichael Mckenzie  01/24/2014   Your procedure is scheduled on:  Wednesday  01/30/14    Report to [facility] (use Main Entrance "A'') at 1100 AM.   Call this number if you have problems the morning of surgery: [phone]   Remember:   Do not eat food or drink liquids after midnight.    Take these medicines the morning of surgery with A SIP OF WATER: metoprolol(Toprol XL)  Stop taking Aspirin, Multivitamins and herbal medications. Do not take any NSAIDs ie: Ibuprofen, Advil, Naproxen or any medication containing Aspirin.  Do not wear jewelry, make-up or nail polish.  Do not wear lotions, powders, or perfumes. You may NOT wear deodorant.  Do not shave 48 hours prior to surgery. Men may shave face and neck.  Do not bring valuables to the hospital.  [facility] is not responsible for any belongings or valuables.               Contacts, dentures or bridgework may not be worn into surgery.  Leave suitcase in the car. After surgery it may be brought to your room.  For patients admitted to the hospital, discharge time is determined by your  treatment team.               Patients discharged the day of surgery will not be allowed to drive home.  Name and phone number of your driver:   Special Instructions:  Special Instructions: [place] - Preparing for Surgery  Before surgery, you can play an important role.  Because skin is not sterile, your skin needs to be as free of germs as possible.  You can reduce the number of germs on you skin by washing with CHG (chlorahexidine gluconate) soap before surgery.  CHG is an antiseptic cleaner which kills germs and bonds with the skin to continue killing germs even after washing.  Please DO NOT use if you have an allergy to CHG or antibacterial soaps.  If your skin becomes reddened/irritated stop using the CHG and inform your nurse when you arrive at Short Stay.  Do not shave (including legs and underarms) for at least 48 hours prior to the  first CHG shower.  You may shave your face.  Please follow these instructions carefully:   1.  Shower with CHG Soap the night before surgery and the morning of Surgery.  2.  If you choose to wash your hair, wash your hair first as usual with your normal shampoo.  3.  After you shampoo, rinse your hair and body thoroughly to remove the Shampoo.  4.  Use CHG as you would any other liquid soap. You can apply chg directly to the skin and wash gently with scrungie or a clean washcloth.  5.  Apply the CHG Soap to your body ONLY FROM THE NECK DOWN.  Do not use on open wounds or open sores.  Avoid contact with your eyes, ears, mouth and genitals (private parts).  Wash genitals (private parts with your normal soap.  6.  Wash thoroughly, paying special attention to the area where your surgery will be performed.  7.  Thoroughly rinse your body with warm water from the neck down.  8.  DO NOT shower/wash with your normal soap after using and rinsing off the CHG Soap.  9.  Pat yourself dry with a clean towel.            10.  Wear clean pajamas.  11.  Place clean sheets on your bed the night of your first shower and do not sleep with pets.  Day of Surgery  Do not apply any lotions/deodorants the morning of surgery.  Please wear clean clothes to the hospital/surgery center.   Please read over the following fact sheets that you were given: Pain Booklet, Coughing and Deep Breathing, MRSA Information and Surgical Site Infection Prevention

## 2014-01-25 ENCOUNTER — Encounter (HOSPITAL_COMMUNITY)
Admission: RE | Admit: 2014-01-25 | Discharge: 2014-01-25 | Disposition: A | Discharge status: Home / Self Care | Payer: Managed Care, Other (non HMO) | Source: Ambulatory Visit | Attending: General Surgery | Admitting: General Surgery

## 2014-01-25 ENCOUNTER — Encounter (HOSPITAL_COMMUNITY): Payer: Self-pay

## 2014-01-25 ENCOUNTER — Encounter (INDEPENDENT_AMBULATORY_CARE_PROVIDER_SITE_OTHER): Payer: Self-pay

## 2014-01-25 DIAGNOSIS — Z01812 Encounter for preprocedural laboratory examination: Secondary | ICD-10-CM | POA: Insufficient documentation

## 2014-01-25 LAB — CBC
HCT: 31.1 % — ABNORMAL LOW (ref 39.0–52.0)
Hemoglobin: 10.3 g/dL — ABNORMAL LOW (ref 13.0–17.0)
MCH: 27.2 pg (ref 26.0–34.0)
MCHC: 33.1 g/dL (ref 30.0–36.0)
MCV: 82.1 fL (ref 78.0–100.0)
Platelets: 137 10*3/uL — ABNORMAL LOW (ref 150–400)
RBC: 3.79 MIL/uL — ABNORMAL LOW (ref 4.22–5.81)
RDW: 15.2 % (ref 11.5–15.5)
WBC: 4 10*3/uL (ref 4.0–10.5)

## 2014-01-25 LAB — BASIC METABOLIC PANEL
BUN: 185 mg/dL — ABNORMAL HIGH (ref 6–23)
CO2: 15 meq/L — AB (ref 19–32)
CREATININE: 17.11 mg/dL — AB (ref 0.50–1.35)
Calcium: 6.1 mg/dL — CL (ref 8.4–10.5)
Chloride: 94 mEq/L — ABNORMAL LOW (ref 96–112)
GFR calc Af Amer: 4 mL/min — ABNORMAL LOW (ref >90)
GFR calc non Af Amer: 3 mL/min — ABNORMAL LOW (ref >90)
Glucose, Bld: 90 mg/dL (ref 70–99)
Potassium: 4.2 mEq/L (ref 3.7–5.3)
SODIUM: 139 meq/L (ref 137–147)

## 2014-01-25 NOTE — Progress Notes (Signed)
Spoke with Cyndra NumbersBernie, CMA at Dr. Derrell Lollingamirez office regarding critical value reported from lab ( calcium 6.1). Cyndra NumbersBernie also made aware that pt creatinine is 17.11. According to Cyndra NumbersBernie, MD will be notified.

## 2014-01-25 NOTE — Progress Notes (Signed)
Pt chart was left for [place], [place] ( anesthesia ) to review abnormal labs (creatinine 17.11 and  BUN 185). At the time that Cyndra NumbersBernie was made aware of abnormal labs, the results for BUN was still pending.

## 2014-01-28 NOTE — Progress Notes (Addendum)
Anesthesia Chart Review:  Patient is a 38 year old male scheduled for insertion of a laparoscopic insertion of continuous ambulatory peritoneal dialysis catheter on 01/30/14 by Dr. Derrell Lollingamirez.  History includes non-smoker, HTN, HLD, CKD, secondary hyperparathyroidism, anemia, wisdom tooth extraction. According to notes, he is not yet on hemodialysis.  He has been on a [facility]. Nephrologist is Dr. Annie SableKellie Goldsborough.  [facility] is [facility].  So far, no [facility]; however, notes indicate that EKB showed SB with sinus arrhythmia, otherwise normal.  Will re-request.    Echo on 07/19/13 [facility]) showed mild LVH with normal chamber size and contraction, visually estimated EF > 55%, trivial MR/TR.  CXR on 07/19/13 [facility]) showed heart size is upper normal, aorta is mildly tortuous, lungs remain clear, non pleural effusion, stable appearance of the chest.  Preoperative labs showed BUN 185/Cr 17.11, K 4.2, low panic calcium on 6.1.  H/H 10.3/31.1. Panic calcium was already called to [facility] o 01/25/14.  I called and spoke with Dr. Kathrene BongoGoldsborough this afternoon.  Her office will contact patient regarding his Tums and calcitriol dosing (she questions his compliance since his result is so low).  She recommends repeating tomorrow afternoon.  Patient to arrive at 1 PM for a STAT repeat BMET.    Velna Ochsllison Arieon Scalzo, PA-C [facility]/Anesthesiology Phone [phone] 01/28/2014 3:14 PM  Addendum: 01/29/2014 4:55 PM Unfortunately, there was a miscommunication regarding his repeat labs at 1 PM.  He was able to come for repeat labs just before 4 PM.  He reports that Dr. Kathrene BongoGoldsborough had him on Caltrate only and instructed him to take 4 Caltrate yesterday and two this morning. He continues to feel fatigued.  He feels in his usual state of health.  He has not noticed any muscle twitching. This afternoon's calcium is still low at 7.0 (normal  8.4 - 10.5).  Discussed with anesthesiologist Dr. Krista BlueSinger.  Plan for patient to take two Caltrate this evening and two in the morning.  We will recheck a STAT BMET on arrival in hopes that his calcium is around 8.0.  Patient is aware. It is his understanding that Dr. Kathrene BongoGoldsborough would like him to begin peritoneal dialysis as soon as able following PD catheter insertion.

## 2014-01-29 ENCOUNTER — Encounter (HOSPITAL_COMMUNITY)
Admission: RE | Admit: 2014-01-29 | Discharge: 2014-01-29 | Disposition: A | Discharge status: Home / Self Care | Payer: Managed Care, Other (non HMO) | Source: Ambulatory Visit | Attending: General Surgery | Admitting: General Surgery

## 2014-01-29 LAB — BASIC METABOLIC PANEL
BUN: 186 mg/dL — ABNORMAL HIGH (ref 6–23)
CHLORIDE: 94 meq/L — AB (ref 96–112)
CO2: 20 mEq/L (ref 19–32)
Calcium: 7 mg/dL — ABNORMAL LOW (ref 8.4–10.5)
Creatinine, Ser: 17.94 mg/dL — ABNORMAL HIGH (ref 0.50–1.35)
GFR calc Af Amer: 3 mL/min — ABNORMAL LOW (ref >90)
GFR calc non Af Amer: 3 mL/min — ABNORMAL LOW (ref >90)
GLUCOSE: 103 mg/dL — AB (ref 70–99)
POTASSIUM: 4 meq/L (ref 3.7–5.3)
Sodium: 140 mEq/L (ref 137–147)

## 2014-01-29 MED ORDER — CEFAZOLIN SODIUM-DEXTROSE 2-3 GM-% IV SOLR
2.0000 g | INTRAVENOUS | Status: AC
  Administered 2014-01-30: 2 g via INTRAVENOUS
  Filled 2014-01-29: qty 50

## 2014-01-29 MED ORDER — CHLORHEXIDINE GLUCONATE 4 % EX LIQD
1.0000 "application " | Freq: Once | CUTANEOUS | Status: DC
  Filled 2014-01-29: qty 15

## 2014-01-30 ENCOUNTER — Encounter (HOSPITAL_COMMUNITY): Payer: Managed Care, Other (non HMO) | Admitting: Vascular Surgery

## 2014-01-30 ENCOUNTER — Encounter (HOSPITAL_COMMUNITY)
Admission: RE | Disposition: A | Discharge status: Home / Self Care | Payer: Self-pay | Source: Ambulatory Visit | Attending: General Surgery

## 2014-01-30 ENCOUNTER — Encounter (HOSPITAL_COMMUNITY): Payer: Self-pay | Admitting: *Deleted

## 2014-01-30 ENCOUNTER — Ambulatory Visit (HOSPITAL_COMMUNITY)
Admission: RE | Admit: 2014-01-30 | Discharge: 2014-01-30 | Disposition: A | Discharge status: Home / Self Care | Payer: Managed Care, Other (non HMO) | Source: Ambulatory Visit | Attending: General Surgery | Admitting: General Surgery

## 2014-01-30 ENCOUNTER — Telehealth (INDEPENDENT_AMBULATORY_CARE_PROVIDER_SITE_OTHER): Payer: Self-pay

## 2014-01-30 ENCOUNTER — Ambulatory Visit (HOSPITAL_COMMUNITY): Payer: Managed Care, Other (non HMO) | Admitting: Anesthesiology

## 2014-01-30 DIAGNOSIS — Z7682 Awaiting organ transplant status: Secondary | ICD-10-CM | POA: Insufficient documentation

## 2014-01-30 DIAGNOSIS — N186 End stage renal disease: Secondary | ICD-10-CM

## 2014-01-30 DIAGNOSIS — E785 Hyperlipidemia, unspecified: Secondary | ICD-10-CM | POA: Insufficient documentation

## 2014-01-30 DIAGNOSIS — Z01812 Encounter for preprocedural laboratory examination: Secondary | ICD-10-CM | POA: Insufficient documentation

## 2014-01-30 DIAGNOSIS — I120 Hypertensive chronic kidney disease with stage 5 chronic kidney disease or end stage renal disease: Secondary | ICD-10-CM | POA: Insufficient documentation

## 2014-01-30 DIAGNOSIS — Z4902 Encounter for fitting and adjustment of peritoneal dialysis catheter: Secondary | ICD-10-CM | POA: Insufficient documentation

## 2014-01-30 DIAGNOSIS — D649 Anemia, unspecified: Secondary | ICD-10-CM | POA: Insufficient documentation

## 2014-01-30 HISTORY — PX: CAPD INSERTION: SHX5233

## 2014-01-30 LAB — BASIC METABOLIC PANEL
BUN: 192 mg/dL — AB (ref 6–23)
CHLORIDE: 96 meq/L (ref 96–112)
CO2: 18 meq/L — AB (ref 19–32)
Calcium: 7.1 mg/dL — ABNORMAL LOW (ref 8.4–10.5)
Creatinine, Ser: 18.07 mg/dL — ABNORMAL HIGH (ref 0.50–1.35)
GFR calc Af Amer: 3 mL/min — ABNORMAL LOW (ref >90)
GFR calc non Af Amer: 3 mL/min — ABNORMAL LOW (ref >90)
GLUCOSE: 104 mg/dL — AB (ref 70–99)
POTASSIUM: 4.1 meq/L (ref 3.7–5.3)
Sodium: 141 mEq/L (ref 137–147)

## 2014-01-30 SURGERY — LAPAROSCOPIC INSERTION CONTINUOUS AMBULATORY PERITONEAL DIALYSIS  (CAPD) CATHETER
Anesthesia: General | Site: Abdomen

## 2014-01-30 MED ORDER — BUPIVACAINE HCL 0.25 % IJ SOLN
INTRAMUSCULAR | Status: DC | PRN
  Administered 2014-01-30: 5 mL

## 2014-01-30 MED ORDER — ROCURONIUM BROMIDE 50 MG/5ML IV SOLN
INTRAVENOUS | Status: AC
  Filled 2014-01-30: qty 1

## 2014-01-30 MED ORDER — 0.9 % SODIUM CHLORIDE (POUR BTL) OPTIME
TOPICAL | Status: DC | PRN
  Administered 2014-01-30: 1000 mL

## 2014-01-30 MED ORDER — ACETAMINOPHEN 650 MG RE SUPP: 650.0000 mg | RECTAL | Status: DC | PRN

## 2014-01-30 MED ORDER — SODIUM CHLORIDE 0.9 % IV SOLN
INTRAVENOUS | Status: DC | PRN
  Administered 2014-01-30: 12:00:00 via INTRAVENOUS

## 2014-01-30 MED ORDER — OXYCODONE HCL 5 MG PO TABS
ORAL_TABLET | ORAL | Status: AC
  Administered 2014-01-30: 10 mg via ORAL
  Filled 2014-01-30: qty 2

## 2014-01-30 MED ORDER — NEOSTIGMINE METHYLSULFATE 1 MG/ML IJ SOLN
INTRAMUSCULAR | Status: AC
  Filled 2014-01-30: qty 10

## 2014-01-30 MED ORDER — PROPOFOL 10 MG/ML IV BOLUS
INTRAVENOUS | Status: AC
  Filled 2014-01-30: qty 20

## 2014-01-30 MED ORDER — MIDAZOLAM HCL 5 MG/5ML IJ SOLN
INTRAMUSCULAR | Status: DC | PRN
  Administered 2014-01-30 (×2): 1 mg via INTRAVENOUS

## 2014-01-30 MED ORDER — PROPOFOL 10 MG/ML IV BOLUS
INTRAVENOUS | Status: DC | PRN
  Administered 2014-01-30: 170 mg via INTRAVENOUS
  Administered 2014-01-30: 30 mg via INTRAVENOUS

## 2014-01-30 MED ORDER — PROMETHAZINE HCL 25 MG/ML IJ SOLN: 6.2500 mg | INTRAMUSCULAR | Status: DC | PRN

## 2014-01-30 MED ORDER — ACETAMINOPHEN 325 MG PO TABS
650.0000 mg | ORAL_TABLET | ORAL | Status: DC | PRN
  Administered 2014-01-30: 650 mg via ORAL

## 2014-01-30 MED ORDER — GLYCOPYRROLATE 0.2 MG/ML IJ SOLN
INTRAMUSCULAR | Status: AC
  Filled 2014-01-30: qty 3

## 2014-01-30 MED ORDER — SODIUM CHLORIDE 0.9 % IR SOLN
Status: DC | PRN
  Administered 2014-01-30: 1000 mL

## 2014-01-30 MED ORDER — OXYCODONE HCL 5 MG/5ML PO SOLN: 5.0000 mg | Freq: Once | ORAL | Status: DC | PRN

## 2014-01-30 MED ORDER — LIDOCAINE HCL (CARDIAC) 20 MG/ML IV SOLN
INTRAVENOUS | Status: DC | PRN
  Administered 2014-01-30: 60 mg via INTRAVENOUS

## 2014-01-30 MED ORDER — HYDROMORPHONE HCL PF 1 MG/ML IJ SOLN
0.2500 mg | INTRAMUSCULAR | Status: DC | PRN
  Administered 2014-01-30: 0.5 mg via INTRAVENOUS
  Administered 2014-01-30 (×2): 0.25 mg via INTRAVENOUS

## 2014-01-30 MED ORDER — ARTIFICIAL TEARS OP OINT
TOPICAL_OINTMENT | OPHTHALMIC | Status: DC | PRN
  Administered 2014-01-30: 1 via OPHTHALMIC

## 2014-01-30 MED ORDER — MIDAZOLAM HCL 2 MG/2ML IJ SOLN
INTRAMUSCULAR | Status: AC
  Filled 2014-01-30: qty 2

## 2014-01-30 MED ORDER — LIDOCAINE HCL (CARDIAC) 20 MG/ML IV SOLN
INTRAVENOUS | Status: AC
  Filled 2014-01-30: qty 5

## 2014-01-30 MED ORDER — SUCCINYLCHOLINE CHLORIDE 20 MG/ML IJ SOLN
INTRAMUSCULAR | Status: AC
  Filled 2014-01-30: qty 1

## 2014-01-30 MED ORDER — OXYCODONE HCL 5 MG PO TABS
5.0000 mg | ORAL_TABLET | ORAL | Status: DC | PRN
Start: 2014-01-30 — End: 2014-01-30
  Administered 2014-01-30: 10 mg via ORAL

## 2014-01-30 MED ORDER — SUCCINYLCHOLINE CHLORIDE 20 MG/ML IJ SOLN
INTRAMUSCULAR | Status: DC | PRN
  Administered 2014-01-30: 110 mg via INTRAVENOUS

## 2014-01-30 MED ORDER — SODIUM CHLORIDE 0.9 % IV SOLN
INTRAVENOUS | Status: DC
  Administered 2014-01-30: 11:00:00 via INTRAVENOUS

## 2014-01-30 MED ORDER — FENTANYL CITRATE 0.05 MG/ML IJ SOLN
INTRAMUSCULAR | Status: DC | PRN
  Administered 2014-01-30: 100 ug via INTRAVENOUS
  Administered 2014-01-30 (×3): 50 ug via INTRAVENOUS

## 2014-01-30 MED ORDER — FENTANYL CITRATE 0.05 MG/ML IJ SOLN
INTRAMUSCULAR | Status: AC
  Filled 2014-01-30: qty 5

## 2014-01-30 MED ORDER — ONDANSETRON HCL 4 MG/2ML IJ SOLN
INTRAMUSCULAR | Status: DC | PRN
  Administered 2014-01-30: 4 mg via INTRAVENOUS

## 2014-01-30 MED ORDER — ACETAMINOPHEN 325 MG PO TABS
ORAL_TABLET | ORAL | Status: AC
  Administered 2014-01-30: 650 mg via ORAL
  Filled 2014-01-30: qty 2

## 2014-01-30 MED ORDER — OXYCODONE-ACETAMINOPHEN 5-325 MG PO TABS: 1.0000 | ORAL_TABLET | ORAL | Status: DC | PRN

## 2014-01-30 MED ORDER — ONDANSETRON HCL 4 MG/2ML IJ SOLN
INTRAMUSCULAR | Status: AC
  Filled 2014-01-30: qty 2

## 2014-01-30 MED ORDER — HYDROMORPHONE HCL PF 1 MG/ML IJ SOLN
INTRAMUSCULAR | Status: AC
  Administered 2014-01-30: 0.25 mg via INTRAVENOUS
  Filled 2014-01-30: qty 1

## 2014-01-30 MED ORDER — OXYCODONE HCL 5 MG PO TABS: 5.0000 mg | ORAL_TABLET | Freq: Once | ORAL | Status: DC | PRN

## 2014-01-30 SURGICAL SUPPLY — 59 items
ADAPTER CATH SAFE LCK II CO PK (MISCELLANEOUS) ×1 IMPLANT
ADAPTER SAFE LOCK II CO PACK (MISCELLANEOUS) ×2
ADAPTER TITANIUM MEDIONICS (MISCELLANEOUS) ×3 IMPLANT
BAG DECANTER FOR FLEXI CONT (MISCELLANEOUS) ×3 IMPLANT
BENZOIN TINCTURE PRP APPL 2/3 (GAUZE/BANDAGES/DRESSINGS) ×3 IMPLANT
BINDER ABD UNIV 12 45-62 (WOUND CARE) ×1 IMPLANT
BINDER ABDOMINAL 46IN 62IN (WOUND CARE) ×3
BLADE SURG ROTATE 9660 (MISCELLANEOUS) IMPLANT
CANISTER SUCTION 2500CC (MISCELLANEOUS) IMPLANT
CATH EXTENDED DIALYSIS (CATHETERS) ×3 IMPLANT
CATH MONCRIEF POPOVICH (CATHETERS) IMPLANT
CHLORAPREP W/TINT 26ML (MISCELLANEOUS) ×3 IMPLANT
COIL SWAN NECK LT (MISCELLANEOUS) IMPLANT
COIL SWAN NECK RT (MISCELLANEOUS) IMPLANT
COVER SURGICAL LIGHT HANDLE (MISCELLANEOUS) ×3 IMPLANT
DECANTER SPIKE VIAL GLASS SM (MISCELLANEOUS) ×3 IMPLANT
DISSECTOR BLUNT TIP ENDO 5MM (MISCELLANEOUS) IMPLANT
DRAPE UTILITY 15X26 W/TAPE STR (DRAPE) ×6 IMPLANT
ELECT CAUTERY BLADE 6.4 (BLADE) ×3 IMPLANT
ELECT REM PT RETURN 9FT ADLT (ELECTROSURGICAL) ×3
ELECTRODE REM PT RTRN 9FT ADLT (ELECTROSURGICAL) ×1 IMPLANT
FALLER STYLET ×3 IMPLANT
GAUZE SPONGE 2X2 8PLY STRL LF (GAUZE/BANDAGES/DRESSINGS) ×1 IMPLANT
GLOVE BIO SURGEON STRL SZ7 (GLOVE) ×3 IMPLANT
GLOVE BIO SURGEON STRL SZ7.5 (GLOVE) ×6 IMPLANT
GLOVE BIOGEL PI IND STRL 6.5 (GLOVE) ×1 IMPLANT
GLOVE BIOGEL PI IND STRL 7.0 (GLOVE) ×2 IMPLANT
GLOVE BIOGEL PI IND STRL 7.5 (GLOVE) ×1 IMPLANT
GLOVE BIOGEL PI INDICATOR 6.5 (GLOVE) ×2
GLOVE BIOGEL PI INDICATOR 7.0 (GLOVE) ×4
GLOVE BIOGEL PI INDICATOR 7.5 (GLOVE) ×2
GLOVE SURG SS PI 7.0 STRL IVOR (GLOVE) ×3 IMPLANT
GOWN STRL REUS W/ TWL LRG LVL3 (GOWN DISPOSABLE) ×2 IMPLANT
GOWN STRL REUS W/ TWL XL LVL3 (GOWN DISPOSABLE) ×1 IMPLANT
GOWN STRL REUS W/TWL 2XL LVL3 (GOWN DISPOSABLE) ×3 IMPLANT
GOWN STRL REUS W/TWL LRG LVL3 (GOWN DISPOSABLE) ×4
GOWN STRL REUS W/TWL XL LVL3 (GOWN DISPOSABLE) ×2
KIT BASIN OR (CUSTOM PROCEDURE TRAY) ×3 IMPLANT
KIT ROOM TURNOVER OR (KITS) ×3 IMPLANT
NEEDLE INSUFFLATION 14GA 120MM (NEEDLE) ×3 IMPLANT
NS IRRIG 1000ML POUR BTL (IV SOLUTION) ×3 IMPLANT
PAD ARMBOARD 7.5X6 YLW CONV (MISCELLANEOUS) ×6 IMPLANT
PENCIL BUTTON HOLSTER BLD 10FT (ELECTRODE) ×3 IMPLANT
SET CYSTO W/LG BORE CLAMP LF (SET/KITS/TRAYS/PACK) ×3 IMPLANT
SET EXTENSION TUBING 8  CATH (SET/KITS/TRAYS/PACK) ×6 IMPLANT
SET IRRIG TUBING LAPAROSCOPIC (IRRIGATION / IRRIGATOR) IMPLANT
SLEEVE ENDOPATH XCEL 5M (ENDOMECHANICALS) IMPLANT
SPONGE GAUZE 2X2 STER 10/PKG (GAUZE/BANDAGES/DRESSINGS) ×2
SUT MNCRL AB 4-0 PS2 18 (SUTURE) ×3 IMPLANT
SUT PROLENE 2 0 CT2 30 (SUTURE) IMPLANT
SUT SILK 0 FSL (SUTURE) ×3 IMPLANT
SUT SILK 3 0 SH 30 (SUTURE) IMPLANT
TOWEL OR 17X26 10 PK STRL BLUE (TOWEL DISPOSABLE) ×3 IMPLANT
TRAY LAPAROSCOPIC (CUSTOM PROCEDURE TRAY) ×3 IMPLANT
TROCAR FALLER TUNNELING (TROCAR) IMPLANT
TROCAR XCEL NON BLADE 8MM B8LT (ENDOMECHANICALS) ×3 IMPLANT
TROCAR XCEL NON-BLD 11X100MML (ENDOMECHANICALS) ×3 IMPLANT
TROCAR XCEL NON-BLD 5MMX100MML (ENDOMECHANICALS) ×3 IMPLANT
TUBING CYSTO DISP (UROLOGICAL SUPPLIES) ×3 IMPLANT

## 2014-01-30 NOTE — Discharge Instructions (Signed)
PERITONEAL DIALYSIS (CAPD) CATHETER PLACEMENT: ° °POST OPERATIVE INSTRUCTIONS ° °1. DIET: Follow a light bland diet the first 24 hours after arrival home, such as soup, liquids, crackers, etc.  Be sure to include lots of fluids daily.  Avoid fast food or heavy meals as your are more likely to get nauseated.   °2. Take your usually prescribed home medications unless otherwise directed. °3. PAIN CONTROL: °a. Pain is best controlled by a usual combination of three different methods TOGETHER: °i. Ice/Heat °ii. Tylenol (over the counter pain medication) °iii. Prescription pain medication °b. Most patients will experience some swelling and bruising around the incisions.  Ice packs or heating pads (30-60 minutes up to 6 times a day) will help. Use ice for the first few days to help decrease swelling and bruising, then switch to heat to help relax tight/sore spots and speed recovery.  Some people prefer to use ice alone, heat alone, alternating between ice & heat.  Experiment to what works for you.  Swelling and bruising can take several weeks to resolve.   °c. It is helpful to take an over-the-counter pain medication regularly for the first few weeks.  Using acetaminophen (Tylenol, etc) 500-650mg four times a day (every meal & bedtime) is usually safest since NSAIDs are not advisable in patients with kidney disease. °d. A  prescription for pain medication (such as oxycodone, hydrocodone, etc) should be given to you upon discharge.  Take your pain medication as prescribed.  °i. If you are having problems/concerns with the prescription medicine (does not control pain, nausea, vomiting, rash, itching, etc), please call us [phone] to see if we need to switch you to a different pain medicine that will work better for you and/or control your side effect better. °ii. If you need a refill on your pain medication, please contact your pharmacy.  They will contact our office to request authorization. Prescriptions will not be  filled after 5 pm or on week-ends. °4. Avoid getting constipated.  Between the surgery and the pain medications, it is common to experience some constipation.  Increasing fluid intake and taking a fiber supplement (such as Metamucil, Citrucel, FiberCon, MiraLax, etc) 1-2 times a day regularly will usually help prevent this problem from occurring.  A mild laxative (prune juice, Milk of Magnesia, MiraLax, etc) should be taken according to package directions if there are no bowel movements after 48 hours.   °5. Wash / shower every day.  You may shower over the dressings as they are waterproof.  Continue to shower over incision(s) after the dressing is off. °6. The Peritoneal Dialysis nurse will remove your waterproof bandages in the Dialysis Center a few days after surgery.  Do not remove the bandages until seen by them. °7. ACTIVITIES as tolerated:   °a. You may resume regular (light) daily activities beginning the next day--such as daily self-care, walking, climbing stairs--gradually increasing activities as tolerated.  If you can walk 30 minutes without difficulty, it is safe to try more intense activity such as jogging, treadmill, bicycling, low-impact aerobics, swimming, etc. °b. Save the most intensive and strenuous activity for last such as sit-ups, heavy lifting, contact sports, etc  Refrain from any heavy lifting or straining until you are off narcotics for pain control.   °c. DO NOT PUSH THROUGH PAIN.  Let pain be your guide: If it hurts to do something, don't do it.  Pain is your body warning you to avoid that activity for another week until the pain goes   down. °d. You may drive when you are no longer taking prescription pain medication, you can comfortably wear a seatbelt, and you can safely maneuver your car and apply brakes. °e. You may have sexual intercourse when it is comfortable.  °FOLLOW UP with the Peritoneal Dialysis nurses closely after surgery.  Call [phone] to help arrange  training/flushes of cathete °  ° -The CAPD nurses & Nephrology usually follow you closely, making the need for follow-up in our office redundant and therefore not needed.  If they or you have concerns, please call us for possible follow-up in our office ° -Please call [facility] at [phone] only as needed. ° °WHEN TO CALL US [phone]: °1. Poor pain control °2. Reactions / problems with new medications (rash/itching, nausea, etc)  °3. Fever over 101.5 F (38.5 C) °4. Worsening swelling or bruising °5. Continued bleeding from incision. °6. Increased pain, redness, or drainage from the incision ° ° The clinic staff is available to answer your questions during regular business hours (8:30am-5pm).  Please don’t hesitate to call and ask to speak to one of our nurses for clinical concerns.  ° If you have a medical emergency, go to the nearest emergency room or call 911. ° A surgeon from [facility] is always on call at the hospitals ° °9. IF YOU HAVE DISABILITY OR FAMILY LEAVE FORMS, BRING THEM TO THE OFFICE FOR PROCESSING.  DO NOT GIVE THEM TO YOUR DOCTOR. ° °[facility] °[address], [address], [place], [place]  [address] ? °MAIN: [phone] ? TOLL FREE: [phone] ?  °FAX [phone] °[email or web address] ° °Peritoneal Dialysis - An Overview °Dialysis can be done using a machine outside of the body (hemodialysis). Or, it can be done inside the body (peritoneal dialysis). The word "peritoneal" refers to the lining or membrane of the belly (abdominal cavity). The peritoneal membrane is a thin, plastic-like lining inside the belly that covers the organs and fits in the abdominal or peritoneal cavity, such as the stomach, liver and the kidneys. This lining works like a filter. It will allow certain things to pass from your blood through the lining and into a special solution that has been placed into your belly. In this type of dialysis, the peritoneum is used to  help clean the blood.  °If you need dialysis, your kidneys are not working right. Healthy kidneys take out extra water and waste products, which becomes urine. When the kidneys do not do this, serious problems can develop. The waste and water build up in the blood. Your hands and feet might swell. You may feel tired, weak or sick to your stomach. Also, your blood pressure may rise. If not treated, you could die. Dialysis is a treatment that does the work that your kidneys would do if they were healthy. °· It cleans your blood.  °· It will make sure your body has the right amount of certain chemicals that it needs. They include potassium, sodium and bicarbonate.  °· It will help control your blood pressure.  °UNDERSTANDING PERITONEAL DIALYSIS °· Here is how peritoneal dialysis works:  °· First, you will have surgery to put a soft plastic tube (catheter) into your belly (abdomen). This will allow you to easily connect yourself to special tubing, which will then let a special dialysis solution to be placed into your abdomen.  °· For each treatment, you will need at least one bag of dialysis solution (a liquid called dialysate). It is   a mix of water that is pure and free of germs (sterile), sugar (dextrose) and the nutrients and minerals found in your blood. Sometimes, more than one bag is needed to get the right amount of fluid for your abdomen. Your caregiver will explain what size and how many bags you will need.  °· The dialysate is slowly put through the catheter to fill the abdomen (called the peritoneal cavity). This dialysate will need to stay in your body for 3-4 hours. This is known as the dwell time.  °· The solution is working to clean the blood and remove wastes from your body. At the end of this time, the solution is drained from your body through tubing into an empty bag. It is then replaced with a fresh dialysate.  °· The draining and replacing of the dialysate is called an exchange or cycle. The  catheter is capped after each exchange. Once the solution is in your body, you are then free to do whatever you would like until the next exchange. Most people will need to do 4-5 exchanges each day.  °· There are two different methods that can be used.  °· Continuous ambulatory peritoneal dialysis (CAPD): You put the solution into your abdomen, cap your catheter and then go about your day. Several hours later, you reconnect to a tubing set up, drain out the solution and then put more solution in. This is done several times a day. No machine is needed.  °· Continuous cycler-assisted peritoneal dialysis (CCPD): A machine is used, which fills the abdomen with dialysate and then drains it. This happens several times. It usually is done at night while you are sleeping. When you wake up, you can disconnect from the machine and are free to go to go about your day.  °PREPARING FOR EXCHANGES °· Discuss the details of the procedure with your caregivers. You will be working with a nurse who is specially trained in doing dialysis. Make sure you understand:  °· How to do an exchange.  °· How much solution you need.  °· What type of solution you will need.  °· How often you should do an exchange. Ask:  °· How many times each day?  °· When? At meals? At bedtime?  °· Always keep the dialysate bags and other supplies in a cool, clean and dry place.  °· Keeping everything clean is very important.  °· The catheter and its cap must be free from germs (sterile)  °· The adapter also must be sterile. It attaches the dialysis bag and tubing to the catheter.  °· Clean the area of your body around the catheter every day. Use a chemical that fights infection (antiseptic).  °· Wash your hands thoroughly before starting an exchange.  °· You may be taught to wear a mask to cover your nose and mouth. This makes infection less likely to happen.  °· You may be taught to close doors, windows and turn off any fans before doing an exchange.  °· Check  the dialysate bag very carefully.  °· Make sure it is the right size bag for you. This information is on the label.  °· Also, make sure it is the right mixture. For some people, the dialysate contents vary. For instance, the mixture might be a stronger solution for overnight.  °· Check the expiration date (the last date you can use the bag). It also is on the label. If the date has gone by, throw away the bag.  °·   The solution should be clear. You should be able to see any writing on the side of the bag clearly through the solution. Do not use a cloudy solution.  °· Gently squeeze the bag to make sure there are no leaks.  °· Use a dry heating pad to warm the dialysate in the bag. Leave the cover on the bag while you do this.  °· This is for comfort. You can skip this step if you want.  °· Never place the bag of solution under warm or hot water. Water from a faucet is not sterile and could cause germs to get into the bag. Infection could then result.  °PERFORMING AN EXCHANGE °· For continuous ambulatory dialysis:  °· Attach the dialysis bag and tubing to your catheter. Hang the bag so that gravity (the natural downward pull) draws the solution down and into your abdomen once the clamps are opened. This should take about 10 minutes.  °· Remove the bag and tubing from the catheter. Cap the catheter.  °· The solution stays in the abdomen for 3-4 hours (dwell time). The solution is working to clean the blood and remove wastes from your body.  °· When you are ready to drain the solution for another exchange, take the cap off the catheter. Then, attach the catheter to tubing, which is attached to an empty bag. Place this empty bag below the abdomen or on the floor or stool and undo the clamps.  °· Gravity helps pull the fluid out of the abdomen and into the bag. The fluid in the bag may look yellow and clear, like urine. It usually takes about 20 minutes to drain the fluid out of the abdomen.  °· When the solution has  drained, start the process again by infusing a new bag of dialysate and then capping the catheter.  °· This should continue until you have used all of the solution that you are to use each day.  °· Sometimes, a small machine is used overnight. It is called a mini-cycler. This is done if the body cannot go all night without an exchange. The machine lets you sleep without having to get up and do an exchange.  °· For continuous cycler-assisted dialysis:  °· You will be taught how to set up or program your machine.  °· When you are ready for bed, put the dialysate bags onto the cycler machine. Put on exactly the number of bags that your caregiver said to use.  °· Connect your catheter to the machine and turn the cycler machine on.  °· Overnight, the cycler will do several exchanges. It often does three to five, sometimes more.  °· Solution that is in your abdomen in the morning will stay during the day. The machine is set to make the daytime solution stronger, if that is needed.  °· In the morning, you will disconnect from the machine and cap your catheter and go about your day.  °· Sometimes, an extra exchange is done during the day. This may be needed to remove excess waste or fluid.  °IMPORTANT REMINDERS °· You will need to follow a very strict schedule. Every step of the dialysis procedure must be done every day. Sometimes, several times a day. Altogether, this might take an extra 2 hours or more. However, you must stick to the routine. Do not skip a day. Do not skip a procedure.  °· Some people find it helpful to work with a counselor or social worker in   addition to the renal (kidney) nurse. They can help you figure out how to change your daily routine to fit in the dialysis sessions.  °· You may need to change your diet. Ask your caregiver for advice, or talk with a nutritionist about what you should and should not eat.  °· You will need to weigh yourself every day and keep track of what your weight is.  °· You  may be taught how to check your blood pressure before every exchange. Your blood pressure reading will help determine what type of solution to use. If your blood pressure is too high, you may need a stronger solution.  °RISKS AND COMPLICATIONS  °Possible problems vary, depending on the method you use. Your overall health also can have an effect. Problems that could develop because of dialysis include: °· Infection. This is the most common problem. It could occur:  °· In the peritoneum. This is called peritonitis.  °· Around the catheter.  °· Weight gain. The dialysate contains a type of sugar known as dextrose. Dextrose has a lot of calories. The body takes in several hundred calories from this sugar each day.  °· Weakened muscles in the abdomen. This can result from all of the fluid that your body has to hold in the abdomen.  °· Catheter replacement. Sometimes, a new one has to be put in.  °· Change in dialysis method. Due to some complications, you may need to change to hemodialysis for a short time and have your dialysis done at a center.  °· Trouble adjusting to your new lifestyle. In some people, this leads to depression.  °· Sleep problems.  °· Dialysis-related amyloidosis. This sometimes occurs after 5 years of dialysis. Protein builds up in the blood. This can cause painful deposits on bones, joints and tendons (which connect muscle to bone). Or, it can cause hollow spots in bones that make them more likely to break.  °· Excess fluid. Your body may absorb too much of the fluid that is held in the abdomen. This can lead to heart or lung problems.  °SEEK MEDICAL CARE IF:  °· You have any problems with an exchange.  °· The area around the catheter becomes red or painful.  °· The catheter seems loose, or it feels like it is coming out.  °· A bag of dialysate looks cloudy. Or, the liquid is an unusual color.  °· Abdominal pain or discomfort.  °· You feel sick to your stomach (nauseous) or throw up (vomit).   °· You develop a fever of more than 102° F (38.9° C).  °SEEK IMMEDIATE MEDICAL CARE IF:  °You develop a fever of more than 102° F (38.9° C). °Document Released: 08/29/2009 Document Revised: 10/21/2011 Document Reviewed: 08/29/2009 °ExitCare® Patient Information ©2012 [employer]. ° °Diet for Peritoneal Dialysis °This diet may be modified in protein, sodium, phosphorus, potassium, or fluid, depending on your needs. The goals of nutrition therapy are similar to those for patients on hemodialysis. Providing enough protein to replace peritoneal losses is a priority. °USES OF THIS DIET °The diet is designed for the patient with end-stage kidney (renal) disease, who is treated by peritoneal dialysis. Treatment options include: °· Continuous Ambulatory Peritoneal Dialysis (CAPD): Usually 4 exchanges of 1.5 to 2 liter volumes of glucose (sugar) and electrolyte-containing dialysate.  °· Continuous Cyclic Peritoneal Dialysis (CCPD): Essentially a reversal of CAPD, with shorter exchanges at night and a longer one during the day.  °· Intermittent Peritoneal Dialysis (IPD): 10 to   12 hours of exchanges, 2 to 3 times weekly.  °ADEQUACY °The diet may not meet the Recommended Dietary Allowances of the National Research Council for calcium and ascorbic acid. Protein and water-soluble vitamin needs may be increased because of losses into the dialysate. Recommended daily supplements are the same as for hemodialysis patients. °ASSESSMENT/DETERMINATION OF DIET °Dietary needs will differ between patients. Parameters must be individualized. °Protein °· Guidelines: 1.2 to 1.3 gm/kg/day OR 1.5 gm/kg/day if patient is malnourished, catabolic, or has a protracted episode of peritonitis. A minimum of 50% of the protein intake should be of high biological value.  °· Goals: Meet protein requirements and replace dialysate losses while avoiding excessive accumulation of waste products. Achieve serum albumin greater than 3.5 g/dL.  °· Evaluate:  Current nutritional status, serum albumin and BUN levels, presence of peritonitis.  °Sodium °· Guidelines: Usually 90 to 175 mEq (2000 to 4000 mg), but should be individualized.  °· Goals: Minimize complications of fluid imbalance.  °· Evaluate: Weight, blood pressure regulation, and presence of swelling (edema).  °Potassium °· Guidelines: Individualized; often not restricted, and may need to be supplemented.  °· Goals: Serum K+ levels between 4.0 to 5.0 mEq/L.  °· Evaluate: Serum K+ levels, usual intake of K+, appetite.  °Phosphorus °· Guidelines: 800 to 1200 mg/day (the high protein intake results in a high obligatory P intake).  °· Goal: Serum P levels between 4.5 to 6.0 mg/dL.  °· Evaluate: Serum P levels, usual P intake, P-binding medications: type, number, dosage, distribution.  °Fluids °· Guidelines: Individualized - may not be restricted for all patients.  °· Goal: Minimize complications of fluid imbalance.  °· Evaluate: Weight, blood pressure regulation, sodium intake, and presence of edema.  °Document Released: 11/01/2005 Document Revised: 10/21/2011 Document Reviewed: 01/24/2007 °ExitCare® Patient Information ©2012 [employer]. ° °

## 2014-01-30 NOTE — Anesthesia Preprocedure Evaluation (Signed)
Anesthesia Evaluation    Reviewed: Allergy & Precautions, H&P , NPO status , Patient's Chart, lab work & pertinent test results  History of Anesthesia Complications Negative for: history of anesthetic complications  Airway       Dental   Pulmonary neg pulmonary ROS,          Cardiovascular hypertension,     Neuro/Psych negative psych ROS   GI/Hepatic negative GI ROS, Neg liver ROS,   Endo/Other  negative endocrine ROS  Renal/GU ESRFRenal disease     Musculoskeletal   Abdominal   Peds  Hematology  (+) anemia ,   Anesthesia Other Findings   Reproductive/Obstetrics                           Anesthesia Physical Anesthesia Plan  ASA: III  Anesthesia Plan: General   Post-op Pain Management:    Induction: Intravenous  Airway Management Planned: Oral ETT  Additional Equipment:   Intra-op Plan:   Post-operative Plan: Extubation in OR  Informed Consent:   Plan Discussed with: CRNA, Anesthesiologist and Surgeon  Anesthesia Plan Comments:         Anesthesia Quick Evaluation

## 2014-01-30 NOTE — Op Note (Signed)
01/30/2014  1:20 PM  PATIENT:  Lawrence Mckenzie  38 y.o. male  PRE-OPERATIVE DIAGNOSIS:  END STAGE RENAL DISEASE  POST-OPERATIVE DIAGNOSIS:  END STAGE RENAL DISEASE  PROCEDURE:  Procedure(s): LAPAROSCOPIC INSERTION CONTINUOUS AMBULATORY PERITONEAL DIALYSIS CATHETER (N/A)  SURGEON:  Surgeon(s) and Role:    * Axel FillerArmando Beretta Ginsberg, MD - Primary  ANESTHESIA:   local and general  EBL:  Total I/O In: 300 [I.V.:300] Out: -   BLOOD ADMINISTERED:none  DRAINS: CAPD catheter left in place   LOCAL MEDICATIONS USED:  BUPIVICAINE   SPECIMEN:  No Specimen  DISPOSITION OF SPECIMEN:  N/A  COUNTS:  YES  TOURNIQUET:  * No tourniquets in log *  DICTATION: .Dragon Dictation  Details of the procedure:The patient was taken back to the operating room. The patient was placed in supine position with bilateral SCDs in place. After appropriate anitbiotics were confirmed, a time-out was confirmed and all facts were verified.  The Veress needle technique was used to insufflate the abdomen to 14 mm mercury after a stab incision was made in the right upper quadrant area. Subsequently to this a 5 mm trocar and camera then placed intra-abdominally. There was no injury to any intra-abdominal organs. A second 8mm trocar was placed under direct visualization. The abdomen was inspected and seen to be free of adhesions, and minimum amount of omentum. At this time the CVP catheter/pigtail catheter was then placed into the abdomen. This was in place down to the rectovesical pouch. At this time a 5 mm trochar was placed in the left lower quadrant direct visualization.  The end of the pigtail catheter was then brought up to the abdominal wall so that the cuff lay in the subfascial space. An incision was made approximately 3 fingerbreadths in the midaxillary line in the left subcostal region. Bovie cautery was used to maintain hemostasis dissection was taken down in the subcutaneous space. A vascular tunneler was used to tunnel  the extension catheter out the left subcostal margin incision. This was then tunneled up towards the costal margin after connecting the extension portion of the catheter. The catheters were trimmed to size prior to attaching the pigtail catheter and the extension catheter.  At this time a trocar, were then used the extension catheter to exit approximately 2-3 cm inferior to the subcostal incision site. The catheter was then attached to cystoscopy to approximately 200 cc sterile saline were infused into the abdomen under gravity. 200 cc within the drain out by gravity. CAPD catheter was working well. Again there was minimal omentum that was in the abdomen and/or near the end of the pigtail catheter.  At this time a the catheter was using the provider titanium hardware. The insufflation was evacuated all trochars were removed. The trocar sites and incision sites were then reapproximated using a 4-0 Monocryl subcuticular fashion. The patient was awakened from general anesthesia was taken to the recovery room in stable condition.  PLAN OF CARE: Discharge to home after PACU  PATIENT DISPOSITION:  PACU - hemodynamically stable.   Delay start of Pharmacological VTE agent (>24hrs) due to surgical blood loss or risk of bleeding: not applicable

## 2014-01-30 NOTE — Transfer of Care (Signed)
Immediate Anesthesia Transfer of Care Note  Patient: Lawrence Mckenzie  Procedure(s) Performed: Procedure(s): LAPAROSCOPIC INSERTION CONTINUOUS AMBULATORY PERITONEAL DIALYSIS CATHETER (N/A)  Patient Location: PACU  Anesthesia Type:General  Level of Consciousness: awake, alert  and oriented  Airway & Oxygen Therapy: Patient Spontanous Breathing and Patient connected to nasal cannula oxygen  Post-op Assessment: Report given to PACU RN and Post -op Vital signs reviewed and stable  Post vital signs: Reviewed and stable  Complications: No apparent anesthesia complications

## 2014-01-30 NOTE — Preoperative (Signed)
Beta Blockers   Reason not to administer Beta Blockers:Not Applicable 

## 2014-01-30 NOTE — H&P (Signed)
  HPI  Georgeanna LeaMichael Jewell is a 38 y.o. male. The patient is a 38 year old male who is referred by Dr. Kathrene BongoGoldsborough for an evaluation for a PD catheter placement. Patient has a history of chronic renal failure of unknown etiology. Patient does have a history of hypertension. Patient is currently not on dialysis at this time but according to Dr. Kathrene BongoGoldsborough is close to needing dialysis at this time. The patient is on a kidney transplant list and has been on it for about 2-4 years. Patient comes in today for initial consultation for a PD catheter placement.  HPI  Past Medical History   Diagnosis  Date   .  Chronic kidney disease    .  Hypertension    .  Hyperlipidemia    .  Anemia    History reviewed. No pertinent past surgical history.  Family History   Problem  Relation  Age of Onset   .  Hypertension  Mother    .  Hypertension  Father    Social History  History   Substance Use Topics   .  Smoking status:  Never Smoker   .  Smokeless tobacco:  Not on file   .  Alcohol Use:  No   No Known Allergies  Current Outpatient Prescriptions   Medication  Sig  Dispense  Refill   .  amLODipine (NORVASC) 5 MG tablet  Take 5 mg by mouth daily.     .  Choline Fenofibrate (TRILIPIX) 135 MG capsule  Take 135 mg by mouth daily.     .  metoprolol succinate (TOPROL-XL) 50 MG 24 hr tablet  Take 50 mg by mouth daily. Take with or immediately following a meal.     .  Vitamin D, Ergocalciferol, (DRISDOL) 50000 UNITS CAPS capsule  Take 1 capsule (50,000 Units total) by mouth every 7 (seven) days.  30 capsule  4    No current facility-administered medications for this visit.   Review of Systems  Review of Systems  Constitutional: Negative.  HENT: Negative.  Eyes: Negative.  Respiratory: Negative.  Cardiovascular: Negative.  Gastrointestinal: Negative.  Endocrine: Negative.  Neurological: Negative.  Blood pressure 118/74, pulse 86, resp. rate 16, height 5\' 10"  (1.778 m), weight 210 lb (95.255 kg).  Physical  Exam  Physical Exam  Constitutional: He is oriented to person, place, and time. He appears well-developed and well-nourished.  HENT:  Head: Normocephalic and atraumatic.  Eyes: Conjunctivae and EOM are normal. Pupils are equal, round, and reactive to light.  Neck: Normal range of motion. Neck supple.  Cardiovascular: Normal rate, regular rhythm and normal heart sounds.  Pulmonary/Chest: Effort normal and breath sounds normal.  Abdominal: Soft. Bowel sounds are normal. He exhibits no distension and no mass. There is no tenderness. There is no rebound and no guarding.  Musculoskeletal: Normal range of motion.  Neurological: He is alert and oriented to person, place, and time.  Skin: Skin is warm and dry.  Data Reviewed  none  Assessment  38 year old male with chronic kidney failure and likely need for dialysis access.  Plan  1. Will proceed with Lap CAPD placement 2. I d/w him the risks and benefits of the procedure to include but not limited to: infection, bleeding, damage to surrounding structures, malfunction of the CAPD catheter, and possible need for repositioning of the catheter.  The patient voiced understanding and wishes to proceed.

## 2014-01-30 NOTE — Telephone Encounter (Signed)
Message copied by Maryan PulsMOORE, Dyamond Tolosa on Wed Jan 30, 2014  2:12 PM ------      Message from: Axel FillerAMIREZ, ARMANDO      Created: Wed Jan 30, 2014  1:32 PM       Can you fax the OP note to Dr. Annie SableKellie Goldsborough please                  AR ------

## 2014-01-30 NOTE — Anesthesia Procedure Notes (Signed)
Procedure Name: Intubation Date/Time: 01/30/2014 12:29 PM Performed by: Gayla MedicusHYPES, Zailey Audia M. Pre-anesthesia Checklist: Patient identified, Emergency Drugs available, Suction available, Patient being monitored and Timeout performed Patient Re-evaluated:Patient Re-evaluated prior to inductionOxygen Delivery Method: Circle system utilized Preoxygenation: Pre-oxygenation with 100% oxygen Intubation Type: IV induction Ventilation: Mask ventilation without difficulty Laryngoscope Size: Mac and 3 Grade View: Grade I Tube type: Oral Tube size: 7.5 mm Number of attempts: 1 Airway Equipment and Method: Stylet Placement Confirmation: ETT inserted through vocal cords under direct vision,  positive ETCO2 and breath sounds checked- equal and bilateral Secured at: 22 cm Tube secured with: Tape Dental Injury: Teeth and Oropharynx as per pre-operative assessment

## 2014-01-30 NOTE — Anesthesia Postprocedure Evaluation (Signed)
  Anesthesia Post-op Note  Patient: Lawrence LeaMichael Mckenzie  Procedure(s) Performed: Procedure(s): LAPAROSCOPIC INSERTION CONTINUOUS AMBULATORY PERITONEAL DIALYSIS CATHETER (N/A)  Patient Location: PACU  Anesthesia Type:General  Level of Consciousness: awake, alert  and oriented  Airway and Oxygen Therapy: Patient Spontanous Breathing and Patient connected to nasal cannula oxygen  Post-op Pain: mild  Post-op Assessment: Post-op Vital signs reviewed, Patient's Cardiovascular Status Stable, Respiratory Function Stable, Patent Airway, No signs of Nausea or vomiting and Pain level controlled  Post-op Vital Signs: stable  Complications: No apparent anesthesia complications

## 2014-01-30 NOTE — Telephone Encounter (Signed)
Op Note faxed to Dr. Kathrene BongoGoldsborough.  Fax confirimation rec'd and attached to outgoing fax.

## 2014-02-05 ENCOUNTER — Encounter (HOSPITAL_COMMUNITY): Payer: Self-pay | Admitting: Pharmacy Technician

## 2014-02-05 ENCOUNTER — Ambulatory Visit (INDEPENDENT_AMBULATORY_CARE_PROVIDER_SITE_OTHER): Payer: Managed Care, Other (non HMO) | Admitting: General Surgery

## 2014-02-05 ENCOUNTER — Encounter (INDEPENDENT_AMBULATORY_CARE_PROVIDER_SITE_OTHER): Payer: Self-pay | Admitting: General Surgery

## 2014-02-05 ENCOUNTER — Encounter (INDEPENDENT_AMBULATORY_CARE_PROVIDER_SITE_OTHER): Payer: Self-pay

## 2014-02-05 ENCOUNTER — Telehealth (INDEPENDENT_AMBULATORY_CARE_PROVIDER_SITE_OTHER): Payer: Self-pay

## 2014-02-05 DIAGNOSIS — Z9889 Other specified postprocedural states: Secondary | ICD-10-CM

## 2014-02-05 NOTE — Progress Notes (Signed)
Patient ID: Lawrence Mckenzie, male   DOB: 1976-06-16, 38 y.o.   MRN: [id number]  No chief complaint on file.   HPI Lawrence Mckenzie is a 38 y.o. male.  The patient is a 38 year old male status post PD catheter placement, one week ago. The patient was seen in dialysis clinic for attempt to flush the catheter. The catheter was then will be for gravity or via syringe. Patient was seen in clinic today secondary this issue. Patient otherwise was doing well from his incisional pain.  HPI  Past Medical History  Diagnosis Date  . Chronic kidney disease   . Hypertension   . Hyperlipidemia   . Anemia     Past Surgical History  Procedure Laterality Date  . Wisdom tooth extraction      Hx; of    Family History  Problem Relation Age of Onset  . Hypertension Mother   . Hypertension Father     Social History History  Substance Use Topics  . Smoking status: Never Smoker   . Smokeless tobacco: Never Used  . Alcohol Use: No    No Known Allergies  Current Outpatient Prescriptions  Medication Sig Dispense Refill  . amLODipine (NORVASC) 10 MG tablet Take 10 mg by mouth daily.      . Choline Fenofibrate (TRILIPIX) 135 MG capsule Take 135 mg by mouth daily.      . furosemide (LASIX) 80 MG tablet Take 80 mg by mouth 2 (two) times daily.      . metoprolol succinate (TOPROL-XL) 50 MG 24 hr tablet Take 50 mg by mouth daily. Take with or immediately following a meal.      . oxyCODONE-acetaminophen (ROXICET) 5-325 MG per tablet Take 1-2 tablets by mouth every 4 (four) hours as needed for severe pain.  30 tablet  0   No current facility-administered medications for this visit.    Review of Systems Review of Systems  Constitutional: Negative.   HENT: Negative.   Eyes: Negative.   Respiratory: Negative.   Cardiovascular: Negative.   Gastrointestinal: Negative.   Endocrine: Negative.   Neurological: Negative.     There were no vitals taken for this visit.  Physical Exam Physical Exam   Constitutional: He is oriented to person, place, and time. He appears well-developed and well-nourished.  HENT:  Head: Normocephalic and atraumatic.  Eyes: Conjunctivae and EOM are normal. Pupils are equal, round, and reactive to light.  Neck: Normal range of motion. Neck supple.  Cardiovascular: Normal rate, regular rhythm and normal heart sounds.   Pulmonary/Chest: Effort normal and breath sounds normal.  Abdominal: Soft. Bowel sounds are normal. He exhibits no distension and no mass. There is no tenderness. There is no rebound and no guarding.  Musculoskeletal: Normal range of motion.  Neurological: He is alert and oriented to person, place, and time.  Skin: Skin is warm and dry.    Data Reviewed none  Assessment    38 year old male status post PD catheter placement, currently with a nonfunctioning PD catheter     Plan     1.will return to the operating room to proceed with a diagnostic laparoscopy and troubleshoot the PD catheter. It is difficult to assess whether or not the dysfunction as was the catheter itself and over the joining leak the subcutaneous space. He the catheter was properly working at the completion of the procedure the operating room. 2. Discussed risks and benefits of procedure to include but not limited to: Bleeding, damage to surrounding structures, possible nonfunction, possible  need for further surgery. Patient voicedunderstanding and wished to proceed.        Marigene Ehlers., Erendira Crabtree 02/05/2014, 10:28 AM

## 2014-02-05 NOTE — Telephone Encounter (Signed)
Faxed RTW note to ElandVeronica @ [facility] [phone].  RTW date 02/11/14.  Fax confirmation rec'd and attached to outgoing fax and [employer] Forms given to Medical Records.

## 2014-02-06 ENCOUNTER — Encounter (HOSPITAL_COMMUNITY): Payer: Self-pay | Admitting: *Deleted

## 2014-02-06 MED ORDER — CEFAZOLIN SODIUM-DEXTROSE 2-3 GM-% IV SOLR
2.0000 g | INTRAVENOUS | Status: AC
  Administered 2014-02-07: 2 g via INTRAVENOUS
  Filled 2014-02-06: qty 50

## 2014-02-06 MED ORDER — CHLORHEXIDINE GLUCONATE 4 % EX LIQD
1.0000 "application " | Freq: Once | CUTANEOUS | Status: DC
  Filled 2014-02-06: qty 15

## 2014-02-07 ENCOUNTER — Encounter (HOSPITAL_COMMUNITY): Payer: Managed Care, Other (non HMO) | Admitting: Anesthesiology

## 2014-02-07 ENCOUNTER — Ambulatory Visit (HOSPITAL_COMMUNITY)
Admission: RE | Admit: 2014-02-07 | Discharge: 2014-02-07 | Disposition: A | Discharge status: Home / Self Care | Payer: Managed Care, Other (non HMO) | Source: Ambulatory Visit | Attending: General Surgery | Admitting: General Surgery

## 2014-02-07 ENCOUNTER — Encounter (HOSPITAL_COMMUNITY)
Admission: RE | Disposition: A | Discharge status: Home / Self Care | Payer: Self-pay | Source: Ambulatory Visit | Attending: General Surgery

## 2014-02-07 ENCOUNTER — Encounter (HOSPITAL_COMMUNITY): Payer: Self-pay | Admitting: *Deleted

## 2014-02-07 ENCOUNTER — Encounter (INDEPENDENT_AMBULATORY_CARE_PROVIDER_SITE_OTHER): Payer: Managed Care, Other (non HMO) | Admitting: General Surgery

## 2014-02-07 ENCOUNTER — Ambulatory Visit (HOSPITAL_COMMUNITY): Payer: Managed Care, Other (non HMO) | Admitting: Anesthesiology

## 2014-02-07 DIAGNOSIS — N189 Chronic kidney disease, unspecified: Secondary | ICD-10-CM | POA: Insufficient documentation

## 2014-02-07 DIAGNOSIS — D649 Anemia, unspecified: Secondary | ICD-10-CM | POA: Insufficient documentation

## 2014-02-07 DIAGNOSIS — T85691A Other mechanical complication of intraperitoneal dialysis catheter, initial encounter: Secondary | ICD-10-CM

## 2014-02-07 DIAGNOSIS — Y841 Kidney dialysis as the cause of abnormal reaction of the patient, or of later complication, without mention of misadventure at the time of the procedure: Secondary | ICD-10-CM | POA: Insufficient documentation

## 2014-02-07 DIAGNOSIS — N186 End stage renal disease: Secondary | ICD-10-CM

## 2014-02-07 DIAGNOSIS — E785 Hyperlipidemia, unspecified: Secondary | ICD-10-CM | POA: Insufficient documentation

## 2014-02-07 DIAGNOSIS — I129 Hypertensive chronic kidney disease with stage 1 through stage 4 chronic kidney disease, or unspecified chronic kidney disease: Secondary | ICD-10-CM | POA: Insufficient documentation

## 2014-02-07 HISTORY — DX: Other specified postprocedural states: Z98.890

## 2014-02-07 HISTORY — DX: Nausea with vomiting, unspecified: R11.2

## 2014-02-07 HISTORY — PX: LAPAROSCOPY: SHX197

## 2014-02-07 LAB — POCT I-STAT EG7
ACID-BASE DEFICIT: 3 mmol/L — AB (ref 0.0–2.0)
Bicarbonate: 23.9 mEq/L (ref 20.0–24.0)
Calcium, Ion: 0.86 mmol/L — ABNORMAL LOW (ref 1.12–1.23)
HCT: 33 % — ABNORMAL LOW (ref 39.0–52.0)
Hemoglobin: 11.2 g/dL — ABNORMAL LOW (ref 13.0–17.0)
O2 SAT: 66 %
PO2 VEN: 38 mmHg (ref 30.0–45.0)
Potassium: 3.9 mEq/L (ref 3.7–5.3)
Sodium: 140 mEq/L (ref 137–147)
TCO2: 25 mmol/L (ref 0–100)
pCO2, Ven: 47.5 mmHg (ref 45.0–50.0)
pH, Ven: 7.31 — ABNORMAL HIGH (ref 7.250–7.300)

## 2014-02-07 LAB — POCT I-STAT 4, (NA,K, GLUC, HGB,HCT)
GLUCOSE: 97 mg/dL (ref 70–99)
HCT: 36 % — ABNORMAL LOW (ref 39.0–52.0)
Hemoglobin: 12.2 g/dL — ABNORMAL LOW (ref 13.0–17.0)
Potassium: 4 mEq/L (ref 3.7–5.3)
Sodium: 140 mEq/L (ref 137–147)

## 2014-02-07 SURGERY — LAPAROSCOPY, DIAGNOSTIC
Anesthesia: General | Site: Abdomen

## 2014-02-07 MED ORDER — PROPOFOL 10 MG/ML IV BOLUS
INTRAVENOUS | Status: AC
  Filled 2014-02-07: qty 20

## 2014-02-07 MED ORDER — ONDANSETRON HCL 4 MG/2ML IJ SOLN
INTRAMUSCULAR | Status: AC
  Filled 2014-02-07: qty 2

## 2014-02-07 MED ORDER — ONDANSETRON HCL 4 MG/2ML IJ SOLN
INTRAMUSCULAR | Status: DC | PRN
  Administered 2014-02-07: 4 mg via INTRAVENOUS

## 2014-02-07 MED ORDER — HYDROMORPHONE HCL PF 1 MG/ML IJ SOLN
INTRAMUSCULAR | Status: AC
  Filled 2014-02-07: qty 1

## 2014-02-07 MED ORDER — HYDROMORPHONE HCL PF 1 MG/ML IJ SOLN
0.2500 mg | INTRAMUSCULAR | Status: DC | PRN
  Administered 2014-02-07: 0.5 mg via INTRAVENOUS

## 2014-02-07 MED ORDER — ACETAMINOPHEN 650 MG RE SUPP: 650.0000 mg | RECTAL | Status: DC | PRN

## 2014-02-07 MED ORDER — ROCURONIUM BROMIDE 50 MG/5ML IV SOLN
INTRAVENOUS | Status: AC
  Filled 2014-02-07: qty 1

## 2014-02-07 MED ORDER — FENTANYL CITRATE 0.05 MG/ML IJ SOLN
INTRAMUSCULAR | Status: AC
  Filled 2014-02-07: qty 5

## 2014-02-07 MED ORDER — GLYCOPYRROLATE 0.2 MG/ML IJ SOLN
INTRAMUSCULAR | Status: AC
  Filled 2014-02-07: qty 1

## 2014-02-07 MED ORDER — CEFAZOLIN SODIUM-DEXTROSE 2-3 GM-% IV SOLR
INTRAVENOUS | Status: AC
  Filled 2014-02-07: qty 50

## 2014-02-07 MED ORDER — SODIUM CHLORIDE 0.9 % IR SOLN
Status: DC | PRN
  Administered 2014-02-07: 1000 mL

## 2014-02-07 MED ORDER — SUCCINYLCHOLINE CHLORIDE 20 MG/ML IJ SOLN
INTRAMUSCULAR | Status: AC
  Filled 2014-02-07: qty 1

## 2014-02-07 MED ORDER — PHENYLEPHRINE 40 MCG/ML (10ML) SYRINGE FOR IV PUSH (FOR BLOOD PRESSURE SUPPORT)
PREFILLED_SYRINGE | INTRAVENOUS | Status: AC
  Filled 2014-02-07: qty 10

## 2014-02-07 MED ORDER — LIDOCAINE HCL (CARDIAC) 20 MG/ML IV SOLN
INTRAVENOUS | Status: DC | PRN
  Administered 2014-02-07: 80 mg via INTRAVENOUS

## 2014-02-07 MED ORDER — FENTANYL CITRATE 0.05 MG/ML IJ SOLN
INTRAMUSCULAR | Status: DC | PRN
  Administered 2014-02-07 (×5): 50 ug via INTRAVENOUS

## 2014-02-07 MED ORDER — SODIUM CHLORIDE 0.9 % IJ SOLN
INTRAMUSCULAR | Status: AC
  Filled 2014-02-07: qty 10

## 2014-02-07 MED ORDER — SODIUM CHLORIDE 0.9 % IJ SOLN: 3.0000 mL | Freq: Two times a day (BID) | INTRAMUSCULAR | Status: DC

## 2014-02-07 MED ORDER — SODIUM CHLORIDE 0.9 % IV SOLN
INTRAVENOUS | Status: DC | PRN
  Administered 2014-02-07 (×2): via INTRAVENOUS

## 2014-02-07 MED ORDER — BUPIVACAINE HCL 0.25 % IJ SOLN
INTRAMUSCULAR | Status: DC | PRN
  Administered 2014-02-07: 30 mL

## 2014-02-07 MED ORDER — OXYCODONE HCL 5 MG PO TABS: 5.0000 mg | ORAL_TABLET | ORAL | Status: DC | PRN

## 2014-02-07 MED ORDER — OXYCODONE HCL 5 MG PO TABS: 5.0000 mg | ORAL_TABLET | Freq: Once | ORAL | Status: DC | PRN

## 2014-02-07 MED ORDER — SODIUM CHLORIDE 0.9 % IJ SOLN: 3.0000 mL | INTRAMUSCULAR | Status: DC | PRN

## 2014-02-07 MED ORDER — SUCCINYLCHOLINE CHLORIDE 20 MG/ML IJ SOLN
INTRAMUSCULAR | Status: DC | PRN
  Administered 2014-02-07: 120 mg via INTRAVENOUS

## 2014-02-07 MED ORDER — ACETAMINOPHEN 325 MG PO TABS: 650.0000 mg | ORAL_TABLET | ORAL | Status: DC | PRN

## 2014-02-07 MED ORDER — MIDAZOLAM HCL 2 MG/2ML IJ SOLN
INTRAMUSCULAR | Status: AC
  Filled 2014-02-07: qty 2

## 2014-02-07 MED ORDER — LIDOCAINE HCL (CARDIAC) 20 MG/ML IV SOLN
INTRAVENOUS | Status: AC
  Filled 2014-02-07: qty 5

## 2014-02-07 MED ORDER — OXYCODONE HCL 5 MG/5ML PO SOLN: 5.0000 mg | Freq: Once | ORAL | Status: DC | PRN

## 2014-02-07 MED ORDER — PROMETHAZINE HCL 25 MG/ML IJ SOLN: 6.2500 mg | INTRAMUSCULAR | Status: DC | PRN

## 2014-02-07 MED ORDER — PROPOFOL 10 MG/ML IV BOLUS
INTRAVENOUS | Status: DC | PRN
  Administered 2014-02-07: 180 mg via INTRAVENOUS

## 2014-02-07 MED ORDER — MIDAZOLAM HCL 5 MG/5ML IJ SOLN
INTRAMUSCULAR | Status: DC | PRN
  Administered 2014-02-07: 2 mg via INTRAVENOUS

## 2014-02-07 MED ORDER — BUPIVACAINE-EPINEPHRINE PF 0.25-1:200000 % IJ SOLN
INTRAMUSCULAR | Status: AC
  Filled 2014-02-07: qty 30

## 2014-02-07 MED ORDER — EPHEDRINE SULFATE 50 MG/ML IJ SOLN
INTRAMUSCULAR | Status: AC
  Filled 2014-02-07: qty 1

## 2014-02-07 MED ORDER — SODIUM CHLORIDE 0.9 % IV SOLN: 250.0000 mL | INTRAVENOUS | Status: DC | PRN

## 2014-02-07 SURGICAL SUPPLY — 50 items
ADAPTER TITANIUM MEDIONICS (MISCELLANEOUS) ×3 IMPLANT
BLADE SURG ROTATE 9660 (MISCELLANEOUS) IMPLANT
CANISTER SUCTION 2500CC (MISCELLANEOUS) IMPLANT
CATH EXTENDED DIALYSIS (CATHETERS) ×3 IMPLANT
CHLORAPREP W/TINT 26ML (MISCELLANEOUS) ×3 IMPLANT
COVER SURGICAL LIGHT HANDLE (MISCELLANEOUS) ×3 IMPLANT
DECANTER SPIKE VIAL GLASS SM (MISCELLANEOUS) ×3 IMPLANT
DERMABOND ADVANCED (GAUZE/BANDAGES/DRESSINGS) ×2
DERMABOND ADVANCED .7 DNX12 (GAUZE/BANDAGES/DRESSINGS) ×1 IMPLANT
DRAPE UTILITY 15X26 W/TAPE STR (DRAPE) ×12 IMPLANT
DRAPE WARM FLUID 44X44 (DRAPE) ×3 IMPLANT
ELECT REM PT RETURN 9FT ADLT (ELECTROSURGICAL) ×3
ELECTRODE REM PT RTRN 9FT ADLT (ELECTROSURGICAL) ×1 IMPLANT
GLOVE BIO SURGEON STRL SZ7.5 (GLOVE) ×3 IMPLANT
GLOVE BIOGEL PI IND STRL 6.5 (GLOVE) ×1 IMPLANT
GLOVE BIOGEL PI IND STRL 7.0 (GLOVE) ×2 IMPLANT
GLOVE BIOGEL PI IND STRL 7.5 (GLOVE) ×1 IMPLANT
GLOVE BIOGEL PI INDICATOR 6.5 (GLOVE) ×2
GLOVE BIOGEL PI INDICATOR 7.0 (GLOVE) ×4
GLOVE BIOGEL PI INDICATOR 7.5 (GLOVE) ×2
GLOVE ECLIPSE 7.5 STRL STRAW (GLOVE) ×3 IMPLANT
GLOVE SS BIOGEL STRL SZ 6.5 (GLOVE) ×1 IMPLANT
GLOVE SUPERSENSE BIOGEL SZ 6.5 (GLOVE) ×2
GLOVE SURG SS PI 7.0 STRL IVOR (GLOVE) ×3 IMPLANT
GOWN STRL REUS W/ TWL LRG LVL3 (GOWN DISPOSABLE) ×3 IMPLANT
GOWN STRL REUS W/ TWL XL LVL3 (GOWN DISPOSABLE) ×1 IMPLANT
GOWN STRL REUS W/TWL LRG LVL3 (GOWN DISPOSABLE) ×6
GOWN STRL REUS W/TWL XL LVL3 (GOWN DISPOSABLE) ×2
KIT BASIN OR (CUSTOM PROCEDURE TRAY) ×3 IMPLANT
KIT ROOM TURNOVER OR (KITS) ×3 IMPLANT
MEDIONICS PERITONEAL DIALYSIS CATHETER ×3 IMPLANT
NEEDLE INSUFFLATION 14GA 120MM (NEEDLE) ×3 IMPLANT
NS IRRIG 1000ML POUR BTL (IV SOLUTION) ×3 IMPLANT
PAD ARMBOARD 7.5X6 YLW CONV (MISCELLANEOUS) ×6 IMPLANT
SCISSORS LAP 5X35 DISP (ENDOMECHANICALS) ×3 IMPLANT
SET CYSTO W/LG BORE CLAMP LF (SET/KITS/TRAYS/PACK) ×3 IMPLANT
SET IRRIG TUBING LAPAROSCOPIC (IRRIGATION / IRRIGATOR) IMPLANT
SLEEVE ENDOPATH XCEL 5M (ENDOMECHANICALS) ×9 IMPLANT
STYLET FALLER MEDIONICS (MISCELLANEOUS) ×3 IMPLANT
SUT MNCRL AB 4-0 PS2 18 (SUTURE) ×3 IMPLANT
SUT SILK 2 0 SH (SUTURE) ×6 IMPLANT
TOWEL OR 17X24 6PK STRL BLUE (TOWEL DISPOSABLE) IMPLANT
TOWEL OR 17X26 10 PK STRL BLUE (TOWEL DISPOSABLE) ×3 IMPLANT
TRAY LAPAROSCOPIC (CUSTOM PROCEDURE TRAY) ×3 IMPLANT
TROCAR (MISCELLANEOUS) ×3 IMPLANT
TROCAR XCEL BLUNT TIP 100MML (ENDOMECHANICALS) IMPLANT
TROCAR XCEL NON BLADE 8MM B8LT (ENDOMECHANICALS) ×3 IMPLANT
TROCAR XCEL NON-BLD 11X100MML (ENDOMECHANICALS) ×3 IMPLANT
TROCAR XCEL NON-BLD 5MMX100MML (ENDOMECHANICALS) ×3 IMPLANT
WATER STERILE IRR 1000ML POUR (IV SOLUTION) IMPLANT

## 2014-02-07 NOTE — Discharge Instructions (Signed)

## 2014-02-07 NOTE — Interval H&P Note (Signed)
History and Physical Interval Note:  02/07/2014 7:06 AM  Lawrence Mckenzie  has presented today for surgery, with the diagnosis of malfunctioning PD catheter  The various methods of treatment have been discussed with the patient and family. After consideration of risks, benefits and other options for treatment, the patient has consented to  Procedure(s): LAPAROSCOPY DIAGNOSTIC/POSSIBLE REPOSTION OF CAPD  (N/A) as a surgical intervention .  The patient's history has been reviewed, patient examined, no change in status, stable for surgery.  I have reviewed the patient's chart and labs.  Questions were answered to the patient's satisfaction.     Lawrence Ehlersamirez Jr., Lawrence Mckenzie

## 2014-02-07 NOTE — Transfer of Care (Signed)
Immediate Anesthesia Transfer of Care Note  Patient: Lawrence LeaMichael Mckenzie  Procedure(s) Performed: Procedure(s): LAPAROSCOPY DIAGNOSTIC; REINSERTION OF CAPD  (N/A)  Patient Location: PACU  Anesthesia Type:General  Level of Consciousness: awake, alert , oriented and patient cooperative  Airway & Oxygen Therapy: Patient Spontanous Breathing and Patient connected to nasal cannula oxygen  Post-op Assessment: Report given to PACU RN and Post -op Vital signs reviewed and stable  Post vital signs: Reviewed  Complications: No apparent anesthesia complications

## 2014-02-07 NOTE — Op Note (Signed)
02/07/2014  8:34 AM  PATIENT:  Georgeanna Lea  38 y.o. male  PRE-OPERATIVE DIAGNOSIS:  malfunctioning PD catheter  POST-OPERATIVE DIAGNOSIS:  same  PROCEDURE:  Procedure(s): LAPAROSCOPY DIAGNOSTIC & PLACEMENT OF CAPD  (N/A)  SURGEON:  Surgeon(s) and Role:    * Axel Filler, MD - Primary  PHYSICIAN ASSISTANT:   ASSISTANTS: none   ANESTHESIA:   local and general  EBL:  Total I/O In: 300 [I.V.:300] Out: -   BLOOD ADMINISTERED:none  DRAINS: CAPD catheter   LOCAL MEDICATIONS USED:  BUPIVICAINE   SPECIMEN:  No Specimen  DISPOSITION OF SPECIMEN:  N/A  COUNTS:  YES  TOURNIQUET:  * No tourniquets in log *  DICTATION: .Dragon Dictation  Details of the procedure: The patient was taken back to the operating room. The patient was placed in supine position with bilateral SCDs in place. After appropriate anitbiotics were confirmed, a time-out was confirmed and all facts were verified. The Veress needle technique was used to insufflate the abdomen to 14 mm mercury after a stab incision was made in the right upper quadrant area, separate from the previous incision. Subsequently to this a 5 mm trocar and camera then placed intra-abdominally. There was no injury to any intra-abdominal organs.  I examined the catheter and it was placed appropriately in the recto-vesical pouch.  I attempted to inject the CAPD port with saline however was unsuccessful.  I opened the previous incision where the peritoneal cuff was sitting and pulled the catheter up.  The upon manipulating the catheter and injecting saline the catheter flushed.  However it was evident that 20cc of the saline that we put in was not going into the abdomen as we looked at the end of catheter.  I removed the entire subcutaneous portion of the catheter adn found a hole in the extension portion of the catheter.    At this time I removed the entire catheter.   A second 8mm trocar was placed under direct visualization. The abdomen was  inspected and seen to be free of adhesions, and minimum amount of omentum. At this time the CVP catheter/pigtail catheter was then placed into the abdomen. This was in place down to the rectovesical pouch. At this time a 5 mm trochar was placed in the left lower quadrant direct visualization. The end of the pigtail catheter was then brought up to the abdominal wall so that the cuff lay in the subfascial space. His previous incision was opened approximately 3 fingerbreadths in the midaxillary line in the left subcostal region. Bovie cautery was used to maintain hemostasis dissection was taken down in the subcutaneous space. A vascular tunneler was used to tunnel the extension catheter out the left subcostal margin incision. This was then tunneled up towards the costal margin after connecting the extension portion of the catheter. The catheters were trimmed to size prior to attaching the pigtail catheter and the extension catheter.   At this time a trocar, were then used the extension catheter to exit approximately 2-3 cm inferior to the subcostal incision site.  I proceeded to suture the catheter to the peritoneum using a 2-0 silk stitch x1.The catheter was then attached to cystoscopy tubing and approximately 200 cc sterile saline were infused into the abdomen under gravity. 500 cc within the drain out by gravity. CAPD catheter was working well. Again there was minimal omentum that was in the abdomen and/or near the end of the pigtail catheter.   At this time a the catheter was using the  provider titanium hardware. The insufflation was evacuated all trochars were removed. The trocar sites and incision sites were then reapproximated using a 4-0 Monocryl subcuticular fashion and Dermabond. The patient was awakened from general anesthesia was taken to the recovery room in stable condition.   PLAN OF CARE: Discharge to home after PACU  PATIENT DISPOSITION:  PACU - hemodynamically stable.   Delay start of  Pharmacological VTE agent (>24hrs) due to surgical blood loss or risk of bleeding: not applicable

## 2014-02-07 NOTE — Anesthesia Procedure Notes (Signed)
Procedure Name: Intubation Date/Time: 02/07/2014 7:24 AM Performed by: Lovie CholOCK, Alveta Quintela K Pre-anesthesia Checklist: Patient identified, Emergency Drugs available, Suction available, Patient being monitored and Timeout performed Patient Re-evaluated:Patient Re-evaluated prior to inductionOxygen Delivery Method: Circle system utilized Preoxygenation: Pre-oxygenation with 100% oxygen Intubation Type: IV induction Ventilation: Mask ventilation without difficulty Laryngoscope Size: Miller and 2 Grade View: Grade I Tube type: Oral Tube size: 7.5 mm Number of attempts: 1 Airway Equipment and Method: Stylet Placement Confirmation: ETT inserted through vocal cords under direct vision,  CO2 detector,  positive ETCO2 and breath sounds checked- equal and bilateral Secured at: 22 cm Tube secured with: Tape Dental Injury: Teeth and Oropharynx as per pre-operative assessment

## 2014-02-07 NOTE — Preoperative (Signed)
Beta Blockers   Reason not to administer Beta Blockers:Not Applicable 

## 2014-02-07 NOTE — Anesthesia Postprocedure Evaluation (Signed)
Anesthesia Post Note  Patient: Lawrence Mckenzie  Procedure(s) Performed: Procedure(s) (LRB): LAPAROSCOPY DIAGNOSTIC; REINSERTION OF CAPD  (N/A)  Anesthesia type: general  Patient location: PACU  Post pain: Pain level controlled  Post assessment: Patient's Cardiovascular Status Stable  Last Vitals:  Filed Vitals:   02/07/14 1015  BP: 131/90  Pulse: 59  Temp:   Resp: 17    Post vital signs: Reviewed and stable  Level of consciousness: sedated  Complications: No apparent anesthesia complications

## 2014-02-07 NOTE — Anesthesia Preprocedure Evaluation (Addendum)
Anesthesia Evaluation  Patient identified by MRN, date of birth, ID band Patient awake    Reviewed: Allergy & Precautions, H&P , NPO status , Patient's Chart, lab work & pertinent test results  History of Anesthesia Complications (+) PONV and history of anesthetic complications  Airway Mallampati: II TM Distance: >3 FB Neck ROM: Full    Dental  (+) Teeth Intact, Dental Advisory Given   Pulmonary neg pulmonary ROS,    Pulmonary exam normal       Cardiovascular hypertension, Pt. on medications and Pt. on home beta blockers     Neuro/Psych negative neurological ROS  negative psych ROS   GI/Hepatic negative GI ROS, Neg liver ROS,   Endo/Other  negative endocrine ROS  Renal/GU ESRFRenal disease     Musculoskeletal   Abdominal   Peds  Hematology  (+) anemia ,   Anesthesia Other Findings   Reproductive/Obstetrics                         Anesthesia Physical Anesthesia Plan  ASA: III  Anesthesia Plan: General   Post-op Pain Management:    Induction: Intravenous  Airway Management Planned: Oral ETT and LMA  Additional Equipment:   Intra-op Plan:   Post-operative Plan: Extubation in OR  Informed Consent: I have reviewed the patients History and Physical, chart, labs and discussed the procedure including the risks, benefits and alternatives for the proposed anesthesia with the patient or authorized representative who has indicated his/her understanding and acceptance.   Dental advisory given  Plan Discussed with: CRNA, Anesthesiologist and Surgeon  Anesthesia Plan Comments:        Anesthesia Quick Evaluation

## 2014-02-07 NOTE — H&P (View-Only) (Signed)
Patient ID: Georgeanna LeaMichael Tonche, male   DOB: 1976-06-16, 38 y.o.   MRN: [id number]  No chief complaint on file.   HPI Georgeanna LeaMichael Peeks is a 38 y.o. male.  The patient is a 38 year old male status post PD catheter placement, one week ago. The patient was seen in dialysis clinic for attempt to flush the catheter. The catheter was then will be for gravity or via syringe. Patient was seen in clinic today secondary this issue. Patient otherwise was doing well from his incisional pain.  HPI  Past Medical History  Diagnosis Date  . Chronic kidney disease   . Hypertension   . Hyperlipidemia   . Anemia     Past Surgical History  Procedure Laterality Date  . Wisdom tooth extraction      Hx; of    Family History  Problem Relation Age of Onset  . Hypertension Mother   . Hypertension Father     Social History History  Substance Use Topics  . Smoking status: Never Smoker   . Smokeless tobacco: Never Used  . Alcohol Use: No    No Known Allergies  Current Outpatient Prescriptions  Medication Sig Dispense Refill  . amLODipine (NORVASC) 10 MG tablet Take 10 mg by mouth daily.      . Choline Fenofibrate (TRILIPIX) 135 MG capsule Take 135 mg by mouth daily.      . furosemide (LASIX) 80 MG tablet Take 80 mg by mouth 2 (two) times daily.      . metoprolol succinate (TOPROL-XL) 50 MG 24 hr tablet Take 50 mg by mouth daily. Take with or immediately following a meal.      . oxyCODONE-acetaminophen (ROXICET) 5-325 MG per tablet Take 1-2 tablets by mouth every 4 (four) hours as needed for severe pain.  30 tablet  0   No current facility-administered medications for this visit.    Review of Systems Review of Systems  Constitutional: Negative.   HENT: Negative.   Eyes: Negative.   Respiratory: Negative.   Cardiovascular: Negative.   Gastrointestinal: Negative.   Endocrine: Negative.   Neurological: Negative.     There were no vitals taken for this visit.  Physical Exam Physical Exam   Constitutional: He is oriented to person, place, and time. He appears well-developed and well-nourished.  HENT:  Head: Normocephalic and atraumatic.  Eyes: Conjunctivae and EOM are normal. Pupils are equal, round, and reactive to light.  Neck: Normal range of motion. Neck supple.  Cardiovascular: Normal rate, regular rhythm and normal heart sounds.   Pulmonary/Chest: Effort normal and breath sounds normal.  Abdominal: Soft. Bowel sounds are normal. He exhibits no distension and no mass. There is no tenderness. There is no rebound and no guarding.  Musculoskeletal: Normal range of motion.  Neurological: He is alert and oriented to person, place, and time.  Skin: Skin is warm and dry.    Data Reviewed none  Assessment    38 year old male status post PD catheter placement, currently with a nonfunctioning PD catheter     Plan     1.will return to the operating room to proceed with a diagnostic laparoscopy and troubleshoot the PD catheter. It is difficult to assess whether or not the dysfunction as was the catheter itself and over the joining leak the subcutaneous space. He the catheter was properly working at the completion of the procedure the operating room. 2. Discussed risks and benefits of procedure to include but not limited to: Bleeding, damage to surrounding structures, possible nonfunction, possible  need for further surgery. Patient voicedunderstanding and wished to proceed.        Marigene Ehlers., Grigor Lipschutz 02/05/2014, 10:28 AM

## 2014-02-10 ENCOUNTER — Emergency Department (HOSPITAL_COMMUNITY)
Admission: EM | Admit: 2014-02-10 | Discharge: 2014-02-10 | Disposition: A | Discharge status: Home / Self Care | Payer: Managed Care, Other (non HMO) | Attending: Emergency Medicine | Admitting: Emergency Medicine

## 2014-02-10 ENCOUNTER — Encounter (HOSPITAL_COMMUNITY): Payer: Self-pay | Admitting: Emergency Medicine

## 2014-02-10 DIAGNOSIS — IMO0001 Reserved for inherently not codable concepts without codable children: Secondary | ICD-10-CM

## 2014-02-10 DIAGNOSIS — Z992 Dependence on renal dialysis: Secondary | ICD-10-CM | POA: Insufficient documentation

## 2014-02-10 DIAGNOSIS — E785 Hyperlipidemia, unspecified: Secondary | ICD-10-CM | POA: Insufficient documentation

## 2014-02-10 DIAGNOSIS — N186 End stage renal disease: Secondary | ICD-10-CM | POA: Insufficient documentation

## 2014-02-10 DIAGNOSIS — Z79899 Other long term (current) drug therapy: Secondary | ICD-10-CM | POA: Insufficient documentation

## 2014-02-10 DIAGNOSIS — IMO0002 Reserved for concepts with insufficient information to code with codable children: Secondary | ICD-10-CM | POA: Insufficient documentation

## 2014-02-10 DIAGNOSIS — Y838 Other surgical procedures as the cause of abnormal reaction of the patient, or of later complication, without mention of misadventure at the time of the procedure: Secondary | ICD-10-CM | POA: Insufficient documentation

## 2014-02-10 DIAGNOSIS — D649 Anemia, unspecified: Secondary | ICD-10-CM | POA: Insufficient documentation

## 2014-02-10 DIAGNOSIS — I120 Hypertensive chronic kidney disease with stage 5 chronic kidney disease or end stage renal disease: Secondary | ICD-10-CM | POA: Insufficient documentation

## 2014-02-10 LAB — CBC
HCT: 27.5 % — ABNORMAL LOW (ref 39.0–52.0)
HEMOGLOBIN: 9.2 g/dL — AB (ref 13.0–17.0)
MCH: 27.3 pg (ref 26.0–34.0)
MCHC: 33.5 g/dL (ref 30.0–36.0)
MCV: 81.6 fL (ref 78.0–100.0)
Platelets: 69 10*3/uL — ABNORMAL LOW (ref 150–400)
RBC: 3.37 MIL/uL — AB (ref 4.22–5.81)
RDW: 15.2 % (ref 11.5–15.5)
WBC: 2.6 10*3/uL — AB (ref 4.0–10.5)

## 2014-02-10 NOTE — Discharge Instructions (Signed)
Keep your scheduled office appointment with Dr. Derrell Lollingamirez next week

## 2014-02-10 NOTE — ED Notes (Signed)
The pt is c/o bleeding from a new CAPD catheter placed the 18th.  He   Had bleeding on his bandage and the bandage was apparently changed at triage.  The CAPD catheter has not been used yet.  The pt is not offering very much information.  Alert skin warm and dry

## 2014-02-10 NOTE — ED Notes (Addendum)
CAPD catheter placed on Thursday. 1st flush on Friday. Noticed bleeding and catheter problem ~ 30 minutes ago. (denies: fever, pain, sob, nvd, dizziness or other sx), admits to "feeling cold".  Alert, NAD, calm, interactive, steady gait. Here with family.

## 2014-02-10 NOTE — ED Notes (Signed)
Surgeon at bedside performing suture care to L lower abdomen HD catheter. Pt tolerating without difficulty, dressing applied over site to secure. No signs of distress noted. No bleeding noted.

## 2014-02-10 NOTE — Consult Note (Signed)
Reason for Consult:bleeding around PD catheter Referring Physician: Cornell BarmanJacubowitz  Lawrence Mckenzie is an 38 y.o. male.  HPI: 38 yo male who is 3 days s/p replacement of CAPD catheter by Dr. Derrell Lollingamirez.  He had been doing fine for a couple of days. This morning, he awoke to a blood-soaked bandage on his side.  Hemodynamically stable.  He presented to the ED for evaluation.  Bleeding noted coming from the PD catheter site.  Labs pending.   Past Medical History  Diagnosis Date  . Chronic kidney disease   . Hypertension   . Hyperlipidemia   . Anemia   . PONV (postoperative nausea and vomiting)     Past Surgical History  Procedure Laterality Date  . Wisdom tooth extraction      Hx; of  . Capd insertion N/A 01/30/2014    Procedure: LAPAROSCOPIC INSERTION CONTINUOUS AMBULATORY PERITONEAL DIALYSIS CATHETER;  Surgeon: Axel FillerArmando Ramirez, MD;  Location: [facility] OR;  Service: General;  Laterality: N/A;    Family History  Problem Relation Age of Onset  . Hypertension Mother   . Hypertension Father     Social History:  reports that he has never smoked. He has never used smokeless tobacco. He reports that he does not drink alcohol or use illicit drugs.  Allergies: No Known Allergies  Medications:  Prior to Admission medications   Medication Sig Start Date End Date Taking? Authorizing Provider  amLODipine (NORVASC) 10 MG tablet Take 10 mg by mouth daily.   Yes Historical Provider, MD  calcitRIOL (ROCALTROL) 0.25 MCG capsule Take 0.25 mcg by mouth daily. 01/23/14  Yes Historical Provider, MD  Choline Fenofibrate (TRILIPIX) 135 MG capsule Take 135 mg by mouth daily.   Yes Historical Provider, MD  epoetin alfa (EPOGEN,PROCRIT) [id number] UNIT/ML injection Inject 20,000 Units into the skin every 14 (fourteen) days.   Yes Historical Provider, MD  furosemide (LASIX) 80 MG tablet Take 80 mg by mouth 2 (two) times daily.   Yes Historical Provider, MD  metoprolol succinate (TOPROL-XL) 50 MG 24 hr tablet Take 50 mg by  mouth daily. Take with or immediately following a meal.   Yes Historical Provider, MD  oxyCODONE-acetaminophen (PERCOCET/ROXICET) 5-325 MG per tablet Take 1-2 tablets by mouth every 4 (four) hours as needed for moderate pain or severe pain. 01/30/14  Yes Axel FillerArmando Ramirez, MD     No results found for this or any previous visit (from the past 48 hour(s)).  No results found.  ROS Blood pressure 155/95, pulse 63, temperature 98.4 F (36.9 C), temperature source Oral, resp. rate 18, height 5\' 10"  (1.778 m), weight 195 lb 8 oz (88.678 kg), SpO2 100.00%. Physical Exam Abd - healed laparoscopic incisions c/d/i R-sided PD catheter insertion site with slow ooze  Assessment/Plan:  Bleeding PD catheter site - will place pursestring suture to see if this stops the bleeding.  Mikael Skoda K. 02/10/2014, 7:48 AM   Pursestring sutures placed - 3/0 Ethilon x 2.  No more oozing.  Sterile dressing applied.   Patient has follow-up appointment with Dr. Derrell Lollingamirez next week.  He can remove the suture at that time.  Wilmon ArmsMatthew K. Corliss Skainssuei, MD, [facility]  General/ Trauma Surgery  02/10/2014 8:12 AM

## 2014-02-10 NOTE — ED Notes (Signed)
Pt transported to CT scan.

## 2014-02-10 NOTE — ED Provider Notes (Addendum)
CSN: [id number]     Arrival date & time 02/10/14  [id number] History   First MD Initiated Contact with Patient 02/10/14 [phone]     Chief Complaint  Patient presents with  . Post-op Problem  . Bleeding/Bruising     (Consider location/radiation/quality/duration/timing/severity/associated sxs/prior Treatment) HPI  Complains of bleeding from CAPD catheter site noticed upon awakening 5 AM today. Patient feels well otherwise. No treatment prior to coming here. Patient had CAPD catheter placed in the operating room here on 02/07/2014. Catheters A. He denies abdominal pain denies vomiting denies fever denies lightheadedness denies other complaint. Past Medical History  Diagnosis Date  . Chronic kidney disease   . Hypertension   . Hyperlipidemia   . Anemia   . PONV (postoperative nausea and vomiting)    Past Surgical History  Procedure Laterality Date  . Wisdom tooth extraction      Hx; of  . Capd insertion N/A 01/30/2014    Procedure: LAPAROSCOPIC INSERTION CONTINUOUS AMBULATORY PERITONEAL DIALYSIS CATHETER;  Surgeon: Axel FillerArmando Ramirez, MD;  Location: [facility] OR;  Service: General;  Laterality: N/A;   Family History  Problem Relation Age of Onset  . Hypertension Mother   . Hypertension Father    History  Substance Use Topics  . Smoking status: Never Smoker   . Smokeless tobacco: Never Used  . Alcohol Use: No    Review of Systems  Constitutional: Negative.   HENT: Negative.   Respiratory: Negative.   Cardiovascular: Negative.   Gastrointestinal: Negative.   Musculoskeletal: Negative.   Skin: Negative.   Neurological: Negative.   Psychiatric/Behavioral: Negative.   All other systems reviewed and are negative.      Allergies  Review of patient's allergies indicates no known allergies.  Home Medications   Current Outpatient Rx  Name  Route  Sig  Dispense  Refill  . amLODipine (NORVASC) 10 MG tablet   Oral   Take 10 mg by mouth daily.         . calcitRIOL (ROCALTROL) 0.25 MCG  capsule   Oral   Take 0.25 mcg by mouth daily.         . Choline Fenofibrate (TRILIPIX) 135 MG capsule   Oral   Take 135 mg by mouth daily.         Marland Kitchen. epoetin alfa (EPOGEN,PROCRIT) [id number] UNIT/ML injection   Subcutaneous   Inject 20,000 Units into the skin every 14 (fourteen) days.         . furosemide (LASIX) 80 MG tablet   Oral   Take 80 mg by mouth 2 (two) times daily.         . metoprolol succinate (TOPROL-XL) 50 MG 24 hr tablet   Oral   Take 50 mg by mouth daily. Take with or immediately following a meal.         . oxyCODONE-acetaminophen (PERCOCET/ROXICET) 5-325 MG per tablet   Oral   Take 1-2 tablets by mouth every 4 (four) hours as needed for moderate pain or severe pain.          BP 155/95  Pulse 63  Temp(Src) 98.4 F (36.9 C) (Oral)  Resp 18  Ht 5\' 10"  (1.778 m)  Wt 195 lb 8 oz (88.678 kg)  BMI 28.05 kg/m2  SpO2 100% Physical Exam  Nursing note and vitals reviewed. Constitutional: He appears well-developed and well-nourished.  HENT:  Head: Normocephalic and atraumatic.  Eyes: Conjunctivae are normal. Pupils are equal, round, and reactive to light.  Neck: Neck supple. No tracheal deviation  present. No thyromegaly present.  Cardiovascular: Normal rate and regular rhythm.   No murmur heard. Pulmonary/Chest: Effort normal and breath sounds normal.  Abdominal: Soft. Bowel sounds are normal. He exhibits no distension. There is no tenderness.   CAPD catheter  at left mid abdomen. Slight bleeding around the catheter insertion site. With fresh blood on the bandage.   Musculoskeletal: Normal range of motion. He exhibits no edema and no tenderness.  Neurological: He is alert. Coordination normal.  Skin: Skin is warm and dry. No rash noted.  Psychiatric: He has a normal mood and affect.    ED Course  Procedures (including critical care time) Labs Review Labs Reviewed - No data to display Imaging Review No results found.   EKG Interpretation None      725 am spoke with Dr. Corliss Skains who will come to see pt Results for orders placed during the hospital encounter of 02/10/14  CBC      Result Value Ref Range   WBC 2.6 (*) 4.0 - 10.5 K/uL   RBC 3.37 (*) 4.22 - 5.81 MIL/uL   Hemoglobin 9.2 (*) 13.0 - 17.0 g/dL   HCT 40.9 (*) 81.1 - 91.4 %   MCV 81.6  78.0 - 100.0 fL   MCH 27.3  26.0 - 34.0 pg   MCHC 33.5  30.0 - 36.0 g/dL   RDW 78.2  95.6 - 21.3 %   Platelets PENDING  150 - 400 K/uL   No results found. 820   Am pt resting comfortably , asymtomatic MDM   Final diagnoses:  None   Pt seen by Dr Corliss Skains . Pt okay to be discharged home and to keep f/u appt with Dr. Derrell Lolling in 8 days Dx :1 post operative bleeding #2 anemia     Doug Sou, MD 02/10/14 0865  Doug Sou, MD 02/10/14 [phone]

## 2014-02-12 ENCOUNTER — Encounter (HOSPITAL_COMMUNITY): Payer: Self-pay | Admitting: General Surgery

## 2014-02-18 ENCOUNTER — Encounter (INDEPENDENT_AMBULATORY_CARE_PROVIDER_SITE_OTHER): Payer: Managed Care, Other (non HMO) | Admitting: General Surgery

## 2014-03-01 ENCOUNTER — Inpatient Hospital Stay (HOSPITAL_COMMUNITY)
Admission: EM | Admit: 2014-03-01 | Discharge: 2014-03-06 | DRG: 683 | Disposition: A | Discharge status: Home / Self Care | Payer: Managed Care, Other (non HMO) | Attending: Internal Medicine | Admitting: Internal Medicine

## 2014-03-01 ENCOUNTER — Encounter (HOSPITAL_COMMUNITY): Payer: Self-pay | Admitting: Emergency Medicine

## 2014-03-01 DIAGNOSIS — R809 Proteinuria, unspecified: Secondary | ICD-10-CM

## 2014-03-01 DIAGNOSIS — R319 Hematuria, unspecified: Secondary | ICD-10-CM

## 2014-03-01 DIAGNOSIS — E872 Acidosis, unspecified: Secondary | ICD-10-CM | POA: Diagnosis present

## 2014-03-01 DIAGNOSIS — E785 Hyperlipidemia, unspecified: Secondary | ICD-10-CM | POA: Diagnosis present

## 2014-03-01 DIAGNOSIS — Z79899 Other long term (current) drug therapy: Secondary | ICD-10-CM

## 2014-03-01 DIAGNOSIS — I10 Essential (primary) hypertension: Secondary | ICD-10-CM

## 2014-03-01 DIAGNOSIS — E440 Moderate protein-calorie malnutrition: Secondary | ICD-10-CM | POA: Insufficient documentation

## 2014-03-01 DIAGNOSIS — I129 Hypertensive chronic kidney disease with stage 1 through stage 4 chronic kidney disease, or unspecified chronic kidney disease: Secondary | ICD-10-CM | POA: Diagnosis present

## 2014-03-01 DIAGNOSIS — N184 Chronic kidney disease, stage 4 (severe): Secondary | ICD-10-CM | POA: Diagnosis present

## 2014-03-01 DIAGNOSIS — D61818 Other pancytopenia: Secondary | ICD-10-CM | POA: Diagnosis present

## 2014-03-01 DIAGNOSIS — Z6826 Body mass index (BMI) 26.0-26.9, adult: Secondary | ICD-10-CM

## 2014-03-01 DIAGNOSIS — N179 Acute kidney failure, unspecified: Principal | ICD-10-CM | POA: Diagnosis present

## 2014-03-01 LAB — CBC WITH DIFFERENTIAL/PLATELET
BASOS ABS: 0.1 10*3/uL (ref 0.0–0.1)
Band Neutrophils: 0 % (ref 0–10)
Basophils Relative: 3 % — ABNORMAL HIGH (ref 0–1)
Blasts: 0 %
EOS ABS: 0.1 10*3/uL (ref 0.0–0.7)
EOS PCT: 3 % (ref 0–5)
HEMATOCRIT: 26.2 % — AB (ref 39.0–52.0)
Hemoglobin: 8.9 g/dL — ABNORMAL LOW (ref 13.0–17.0)
Lymphocytes Relative: 24 % (ref 12–46)
Lymphs Abs: 0.5 10*3/uL — ABNORMAL LOW (ref 0.7–4.0)
MCH: 26.6 pg (ref 26.0–34.0)
MCHC: 34 g/dL (ref 30.0–36.0)
MCV: 78.2 fL (ref 78.0–100.0)
METAMYELOCYTES PCT: 0 %
MONO ABS: 0.2 10*3/uL (ref 0.1–1.0)
MONOS PCT: 11 % (ref 3–12)
Myelocytes: 0 %
NEUTROS ABS: 1 10*3/uL — AB (ref 1.7–7.7)
NRBC: 0 /100{WBCs}
Neutrophils Relative %: 59 % (ref 43–77)
PLATELETS: 99 10*3/uL — AB (ref 150–400)
Promyelocytes Absolute: 0 %
RBC: 3.35 MIL/uL — ABNORMAL LOW (ref 4.22–5.81)
RDW: 15.6 % — ABNORMAL HIGH (ref 11.5–15.5)
WBC: 1.9 10*3/uL — AB (ref 4.0–10.5)

## 2014-03-01 LAB — COMPREHENSIVE METABOLIC PANEL
ALT: 141 U/L — ABNORMAL HIGH (ref 0–53)
AST: 178 U/L — ABNORMAL HIGH (ref 0–37)
Albumin: 3.4 g/dL — ABNORMAL LOW (ref 3.5–5.2)
Alkaline Phosphatase: 44 U/L (ref 39–117)
BUN: 242 mg/dL — AB (ref 6–23)
CALCIUM: 6 mg/dL — AB (ref 8.4–10.5)
CO2: 16 mEq/L — ABNORMAL LOW (ref 19–32)
Chloride: 90 mEq/L — ABNORMAL LOW (ref 96–112)
Creatinine, Ser: 24.49 mg/dL — ABNORMAL HIGH (ref 0.50–1.35)
GFR calc Af Amer: 2 mL/min — ABNORMAL LOW (ref >90)
GFR calc non Af Amer: 2 mL/min — ABNORMAL LOW (ref >90)
Glucose, Bld: 107 mg/dL — ABNORMAL HIGH (ref 70–99)
Potassium: 3.9 mEq/L (ref 3.7–5.3)
SODIUM: 138 meq/L (ref 137–147)
TOTAL PROTEIN: 6.7 g/dL (ref 6.0–8.3)
Total Bilirubin: 0.6 mg/dL (ref 0.3–1.2)

## 2014-03-01 LAB — CBC
HEMATOCRIT: 25.6 % — AB (ref 39.0–52.0)
HEMOGLOBIN: 8.4 g/dL — AB (ref 13.0–17.0)
MCH: 26.2 pg (ref 26.0–34.0)
MCHC: 32.8 g/dL (ref 30.0–36.0)
MCV: 79.8 fL (ref 78.0–100.0)
Platelets: 73 10*3/uL — ABNORMAL LOW (ref 150–400)
RBC: 3.21 MIL/uL — ABNORMAL LOW (ref 4.22–5.81)
RDW: 15.8 % — AB (ref 11.5–15.5)
WBC: 1.6 10*3/uL — ABNORMAL LOW (ref 4.0–10.5)

## 2014-03-01 LAB — I-STAT VENOUS BLOOD GAS, ED
Acid-base deficit: 6 mmol/L — ABNORMAL HIGH (ref 0.0–2.0)
Bicarbonate: 18.7 mEq/L — ABNORMAL LOW (ref 20.0–24.0)
O2 Saturation: 93 %
PH VEN: 7.367 — AB (ref 7.250–7.300)
TCO2: 20 mmol/L (ref 0–100)
pCO2, Ven: 32.7 mmHg — ABNORMAL LOW (ref 45.0–50.0)
pO2, Ven: 69 mmHg — ABNORMAL HIGH (ref 30.0–45.0)

## 2014-03-01 LAB — CREATININE, SERUM
Creatinine, Ser: 26.42 mg/dL — ABNORMAL HIGH (ref 0.50–1.35)
GFR calc Af Amer: 2 mL/min — ABNORMAL LOW (ref >90)
GFR, EST NON AFRICAN AMERICAN: 2 mL/min — AB (ref >90)

## 2014-03-01 LAB — URINALYSIS, ROUTINE W REFLEX MICROSCOPIC
Bilirubin Urine: NEGATIVE
Glucose, UA: NEGATIVE mg/dL
Ketones, ur: NEGATIVE mg/dL
LEUKOCYTES UA: NEGATIVE
NITRITE: NEGATIVE
PH: 5.5 (ref 5.0–8.0)
Protein, ur: 100 mg/dL — AB
SPECIFIC GRAVITY, URINE: 1.016 (ref 1.005–1.030)
Urobilinogen, UA: 0.2 mg/dL (ref 0.0–1.0)

## 2014-03-01 LAB — URINE MICROSCOPIC-ADD ON

## 2014-03-01 MED ORDER — SODIUM CHLORIDE 0.9 % IV SOLN
INTRAVENOUS | Status: DC
  Administered 2014-03-01: 20:00:00 via INTRAVENOUS

## 2014-03-01 MED ORDER — ONDANSETRON HCL 4 MG PO TABS: 4.0000 mg | ORAL_TABLET | Freq: Four times a day (QID) | ORAL | Status: DC | PRN

## 2014-03-01 MED ORDER — HEPARIN SODIUM (PORCINE) 5000 UNIT/ML IJ SOLN: 5000.0000 [IU] | Freq: Three times a day (TID) | INTRAMUSCULAR | Status: DC

## 2014-03-01 MED ORDER — ACETAMINOPHEN 325 MG PO TABS
650.0000 mg | ORAL_TABLET | Freq: Four times a day (QID) | ORAL | Status: DC | PRN
  Administered 2014-03-06: 650 mg via ORAL
  Filled 2014-03-01: qty 2

## 2014-03-01 MED ORDER — AMLODIPINE BESYLATE 10 MG PO TABS
10.0000 mg | ORAL_TABLET | Freq: Every day | ORAL | Status: DC
  Administered 2014-03-02 – 2014-03-04 (×3): 10 mg via ORAL
  Filled 2014-03-01 (×3): qty 1

## 2014-03-01 MED ORDER — ONDANSETRON HCL 4 MG/2ML IJ SOLN: 4.0000 mg | Freq: Once | INTRAMUSCULAR | Status: DC

## 2014-03-01 MED ORDER — ACETAMINOPHEN 650 MG RE SUPP: 650.0000 mg | Freq: Four times a day (QID) | RECTAL | Status: DC | PRN

## 2014-03-01 MED ORDER — HYDROMORPHONE HCL PF 1 MG/ML IJ SOLN
1.0000 mg | INTRAMUSCULAR | Status: DC | PRN
  Administered 2014-03-01 – 2014-03-05 (×8): 1 mg via INTRAVENOUS
  Filled 2014-03-01 (×8): qty 1

## 2014-03-01 MED ORDER — DELFLEX-LM/1.5% DEXTROSE 346 MOSM/L IP SOLN: INTRAPERITONEAL | Status: DC

## 2014-03-01 MED ORDER — METOPROLOL SUCCINATE ER 50 MG PO TB24
50.0000 mg | ORAL_TABLET | Freq: Every day | ORAL | Status: DC
  Administered 2014-03-02 – 2014-03-04 (×3): 50 mg via ORAL
  Filled 2014-03-01 (×3): qty 1

## 2014-03-01 MED ORDER — ONDANSETRON HCL 4 MG/2ML IJ SOLN
4.0000 mg | Freq: Four times a day (QID) | INTRAMUSCULAR | Status: DC | PRN
  Administered 2014-03-01 – 2014-03-04 (×3): 4 mg via INTRAVENOUS
  Filled 2014-03-01 (×3): qty 2

## 2014-03-01 MED ORDER — CALCITRIOL 0.5 MCG PO CAPS
0.5000 ug | ORAL_CAPSULE | Freq: Every day | ORAL | Status: DC
  Administered 2014-03-02 – 2014-03-06 (×5): 0.5 ug via ORAL
  Filled 2014-03-01 (×5): qty 1

## 2014-03-01 MED ORDER — CALCITRIOL 0.25 MCG PO CAPS: 0.2500 ug | ORAL_CAPSULE | Freq: Every day | ORAL | Status: DC

## 2014-03-01 MED ORDER — MORPHINE SULFATE 2 MG/ML IJ SOLN: 2.0000 mg | INTRAMUSCULAR | Status: DC | PRN

## 2014-03-01 MED ORDER — CALCIUM CARBONATE ANTACID 500 MG PO CHEW: 1.0000 | CHEWABLE_TABLET | Freq: Every day | ORAL | Status: DC

## 2014-03-01 MED ORDER — FENOFIBRATE 160 MG PO TABS
160.0000 mg | ORAL_TABLET | Freq: Every day | ORAL | Status: DC
  Administered 2014-03-02 – 2014-03-06 (×5): 160 mg via ORAL
  Filled 2014-03-01 (×5): qty 1

## 2014-03-01 MED ORDER — CALCIUM CARBONATE ANTACID 500 MG PO CHEW
800.0000 mg | CHEWABLE_TABLET | Freq: Three times a day (TID) | ORAL | Status: DC
  Filled 2014-03-01 (×4): qty 4

## 2014-03-01 NOTE — Consult Note (Signed)
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  Pt has been moving towards starting PD with hopes of having premptive transplant.  PD catheter placed in March and required redo with [facility] on 3/26.  Has been flushing well and plan for starting to use 4/27 tentatively.  No F/C, abd pain.  No CP/SOB.    In ED pt with worsened azotemia, SCr of 26 and BUN of 240.    Creat (mg/dL)  Date Value  0/98/11919/28/2014 8.13*  12/20/2011 6.09*     Creatinine, Ser (mg/dL)  Date Value  4/78/29564/17/2015 26.42*  03/01/2014 24.49*  01/30/2014 18.07*  01/29/2014 17.94*  01/25/2014 17.11*  ]  ROS Balance of 12 systems is negative w/ exceptions as above  PMH  Past Medical History  Diagnosis Date  . Chronic kidney disease   . Hypertension   . Hyperlipidemia   . Anemia   . PONV (postoperative nausea and vomiting)    PSH  Past Surgical History  Procedure Laterality Date  . Wisdom tooth extraction      Hx; of  . Capd insertion N/A 01/30/2014    Procedure: LAPAROSCOPIC INSERTION CONTINUOUS AMBULATORY PERITONEAL DIALYSIS CATHETER;  Surgeon: Lawrence FillerArmando Ramirez, MD;  Location: [facility] OR;  Service: General;  Laterality: N/A;  . Laparoscopy N/A 02/07/2014    Procedure: LAPAROSCOPY DIAGNOSTIC; REINSERTION OF CAPD ;  Surgeon: Lawrence FillerArmando Ramirez, MD;  Location: [facility] OR;  Service: General;  Laterality: N/A;   FH  Family History  Problem Relation Age of Onset  . Hypertension Mother   . Hypertension Father    SH  reports that he has never smoked. He has never used smokeless tobacco. He reports that he does not drink alcohol or use illicit drugs. Allergies No Known Allergies Home medications Prior to Admission medications   Medication Sig Start Date End Date Taking? Authorizing Provider  amLODipine (NORVASC) 10 MG tablet Take 10 mg by mouth daily.    Yes Historical Provider, MD  calcitRIOL (ROCALTROL) 0.25 MCG capsule Take 0.25 mcg by mouth daily. 01/23/14  Yes Historical Provider, MD  calcium carbonate (TUMS - DOSED IN MG ELEMENTAL CALCIUM) 500 MG chewable tablet Chew 1 tablet by mouth daily.   Yes Historical Provider, MD  Choline Fenofibrate (TRILIPIX) 135 MG capsule Take 135 mg by mouth daily.   Yes Historical Provider, MD  epoetin alfa (EPOGEN,PROCRIT) [id number] UNIT/ML injection Inject 20,000 Units into the skin every 14 (fourteen) days.   Yes Historical Provider, MD  furosemide (LASIX) 80 MG tablet Take 80 mg by mouth 2 (two) times daily.   Yes Historical Provider, MD  metoprolol succinate (TOPROL-XL) 50 MG 24 hr tablet Take 50 mg by mouth daily. Take with or immediately following a meal.   Yes Historical Provider, MD    Current Medications Current Facility-Administered Medications  Medication Dose Route Frequency Provider Last Rate Last Dose  . 0.9 %  sodium chloride infusion   Intravenous Continuous Lawrence KiefStephen K Chiu, MD 75 mL/hr at 03/01/14 2017    . acetaminophen (TYLENOL) tablet 650 mg  650 mg Oral Q6H PRN Lawrence KiefStephen K Chiu, MD       Or  . acetaminophen (TYLENOL) suppository 650 mg  650 mg Rectal Q6H PRN Lawrence KiefStephen K Chiu, MD      . Melene Muller[START ON 03/02/2014] amLODipine (NORVASC) tablet 10 mg  10 mg Oral Daily Lawrence SeniorStephen  Junious DresserK Chiu, MD      . Melene Muller[START ON 03/02/2014] calcitRIOL (ROCALTROL) capsule 0.25 mcg  0.25 mcg Oral Daily Lawrence KiefStephen K Chiu, MD      . Melene Muller[START ON 03/02/2014] calcium carbonate (TUMS - dosed in mg elemental calcium) chewable tablet 200 mg of elemental calcium  1 tablet Oral Daily Lawrence KiefStephen K Chiu, MD      . dialysis solution 1.5% low-MG (DELFLEX) CAPD   Intraperitoneal Continuous Lawrence Mckenzie Lawrence Alger Kerstein, MD      . Melene Muller[START ON 03/02/2014] fenofibrate tablet 160 mg  160 mg Oral Daily Lawrence KiefStephen K Chiu, MD      . Melene Muller[START ON 03/02/2014] metoprolol succinate (TOPROL-XL) 24 hr tablet 50 mg  50 mg Oral Daily Lawrence KiefStephen K Chiu, MD      . morphine 2 MG/ML injection 2 mg  2 mg  Intravenous Q4H PRN Lawrence KiefStephen K Chiu, MD      . ondansetron [facility](ZOFRAN) injection 4 mg  4 mg Intravenous Once Lawrence BostonStirling Harper, MD      . ondansetron [facility](ZOFRAN) tablet 4 mg  4 mg Oral Q6H PRN Lawrence KiefStephen K Chiu, MD       Or  . ondansetron [facility](ZOFRAN) injection 4 mg  4 mg Intravenous Q6H PRN Lawrence KiefStephen K Chiu, MD   4 mg at 03/01/14 2048    CBC  Recent Labs Lab 03/01/14 1242 03/01/14 1710  WBC 1.9* 1.6*  NEUTROABS 1.0*  --   HGB 8.9* 8.4*  HCT 26.2* 25.6*  MCV 78.2 79.8  PLT 99* 73*   Basic Metabolic Panel  Recent Labs Lab 03/01/14 1242 03/01/14 1710  NA 138  --   K 3.9  --   CL 90*  --   CO2 16*  --   GLUCOSE 107*  --   BUN 242*  --   CREATININE 24.49* 26.42*  CALCIUM 6.0*  --     Physical Exam  Blood pressure 125/83, pulse 70, temperature 98.6 F (37 C), temperature source Oral, resp. rate 17, height 5\' 10"  (1.778 m), weight 88.043 kg (194 lb 1.6 oz), SpO2 100.00%. GEN: awake, alert, provides some history ENT: NCAT, MMM EYES: EOMI CV: RRR. No audible rub.  No murmur PULM: CTAB, nl wob ABD: s/nt/nd.  PD cath site bandaged, c/d/i SKIN: no frost.   EXT:no LEE   Assessment 65M presenting with uremia, azotemia, and new start dialysis with PD catheter placement on 02/07/14  1. CKD-5PD / ESRD 2. Uremia 3. Leukopenia / Thrombocytopenia 4. Hypocalcemia 5. Metabolic Acidosis 6. Anemia  PLAN 1. Begin PD tonight as low volume, supine, nocturnal PD (6, 1L exchanges with 1h dwell each, 1.5% dextrose, normal Ca)_ 2. PD fluid cell count and Cx before use 3. Increase calcitriol to 0.5 mcg daily 4. Increase tums to 800mg  elemental Ca qAC 5. No morphine as pain control 2/2 accumulation of metabolites, will write for hydromorphone in its place 6. [facility] PD [facility]; will need to be metabolically stable with less uremia 7. Monitor CBC daily for leukopenia   8. Stop NS IVFs 9. Check iron studies, B12, Folate, retic count 10. If here past weekend  cont ESA in house as Isabella Stallingaranesp  Yuriana Gaal MD 03/01/2014, 9:16 PM     '

## 2014-03-01 NOTE — H&P (Signed)
[facility] History and Physical  Lawrence LeaMichael Mongiello ZOX:[id number] DOB: 1976/03/30 DOA: 03/01/2014  Referring physician: Emergency Department PCP: Cecille AverGOLDSBOROUGH,KELLIE A, MD  Specialists:   Chief Complaint: Worsening Renal function  HPI: Lawrence Mckenzie is a 38 y.o. male  With a hx of stage 4 ckd, htn, hld, and anemia who presents to the ED with worsening renal failure (baseline Cr one month ago was 18, now 25).  The patient was subsequently referred to the ED for further management. In the ED, nephrology was consulted for recs, who had suggested admission by the hospitalist service.   Of note, pt is currently mildly confused and an acurate history is difficult to obtain from the patient. Pt otherwise denies sob or chest pains. Does complain of nausea.  Review of Systems:  Per above, the remainder of the 10pt ros reviewed and are neg  Past Medical History  Diagnosis Date  . Chronic kidney disease   . Hypertension   . Hyperlipidemia   . Anemia   . PONV (postoperative nausea and vomiting)    Past Surgical History  Procedure Laterality Date  . Wisdom tooth extraction      Hx; of  . Capd insertion N/A 01/30/2014    Procedure: LAPAROSCOPIC INSERTION CONTINUOUS AMBULATORY PERITONEAL DIALYSIS CATHETER;  Surgeon: Axel FillerArmando Ramirez, MD;  Location: [facility] OR;  Service: General;  Laterality: N/A;  . Laparoscopy N/A 02/07/2014    Procedure: LAPAROSCOPY DIAGNOSTIC; REINSERTION OF CAPD ;  Surgeon: Axel FillerArmando Ramirez, MD;  Location: [facility] OR;  Service: General;  Laterality: N/A;   Social History:  reports that he has never smoked. He has never used smokeless tobacco. He reports that he does not drink alcohol or use illicit drugs.  where does patient live--home, ALF, SNF? and with whom if at home? Can patient participate in ADLs?  No Known Allergies  Family History  Problem Relation Age of Onset  . Hypertension Mother   . Hypertension Father     (be sure to complete)  Prior to Admission medications    Medication Sig Start Date End Date Taking? Authorizing Provider  amLODipine (NORVASC) 10 MG tablet Take 10 mg by mouth daily.   Yes Historical Provider, MD  calcitRIOL (ROCALTROL) 0.25 MCG capsule Take 0.25 mcg by mouth daily. 01/23/14  Yes Historical Provider, MD  calcium carbonate (TUMS - DOSED IN MG ELEMENTAL CALCIUM) 500 MG chewable tablet Chew 1 tablet by mouth daily.   Yes Historical Provider, MD  Choline Fenofibrate (TRILIPIX) 135 MG capsule Take 135 mg by mouth daily.   Yes Historical Provider, MD  epoetin alfa (EPOGEN,PROCRIT) [id number] UNIT/ML injection Inject 20,000 Units into the skin every 14 (fourteen) days.   Yes Historical Provider, MD  furosemide (LASIX) 80 MG tablet Take 80 mg by mouth 2 (two) times daily.   Yes Historical Provider, MD  metoprolol succinate (TOPROL-XL) 50 MG 24 hr tablet Take 50 mg by mouth daily. Take with or immediately following a meal.   Yes Historical Provider, MD   Physical Exam: Filed Vitals:   03/01/14 1530 03/01/14 1545 03/01/14 1600 03/01/14 1615  BP: 122/85 126/83 128/85 129/85  Pulse: 71 72 71 69  Temp:      TempSrc:      Resp: 15 19 15 16   Height:      Weight:      SpO2: 99% 100% 100% 100%     General:  Awake, sleepy appearing, in nad  Eyes: PERRL B  ENT: membranes moist, dentition fair  Neck: trachea midline,neck supple  Cardiovascular: regular, s1, s2  Respiratory: normal resp effort, no wheezing  Abdomen: soft, nondistended  Skin: normal skin turgor, no abnormal skin lesions seen  Musculoskeletal: perfused, no clubbing  Psychiatric: mood/affect normal// no auditory/visual hallucinations  Neurologic: cn2-12 grossly intact, strength/sensation intact  Labs on Admission:  Basic Metabolic Panel:  Recent Labs Lab 03/01/14 1242  NA 138  K 3.9  CL 90*  CO2 16*  GLUCOSE 107*  BUN 242*  CREATININE 24.49*  CALCIUM 6.0*   Liver Function Tests:  Recent Labs Lab 03/01/14 1242  AST 178*  ALT 141*  ALKPHOS 44  BILITOT  0.6  PROT 6.7  ALBUMIN 3.4*   No results found for this basename: LIPASE, AMYLASE,  in the last 168 hours No results found for this basename: AMMONIA,  in the last 168 hours CBC:  Recent Labs Lab 03/01/14 1242  WBC 1.9*  NEUTROABS 1.0*  HGB 8.9*  HCT 26.2*  MCV 78.2  PLT 99*   Cardiac Enzymes: No results found for this basename: CKTOTAL, CKMB, CKMBINDEX, TROPONINI,  in the last 168 hours  BNP (last 3 results) No results found for this basename: PROBNP,  in the last 8760 hours CBG: No results found for this basename: GLUCAP,  in the last 168 hours  Radiological Exams on Admission: No results found.  EKG: Independently reviewed. NSR  Assessment/Plan Principal Problem:   ARF (acute renal failure) Active Problems:   Chronic kidney disease (CKD), stage IV (severe)   AKI (acute kidney injury)   1. Acute on chronic renal failure 1. Cr 18 -> 25 2. Discussed with nephrology with plans for dialysis 3. Will hold lasix for now 4. Sodium and potassium unremarkable 5. Nephrology to coordinate with General Surgery regarding hx of recurrent malfunctioning dialysis catheter 6. Admit to med-surg, inpt status 2. HTN 1. BP stable and controlled 2. Cont current regimen 3. Pancytopenia 1. Suspect related to ESRD 2. Currently hemodynamically stable 3. Monitor for now 4. DVT prophylaxis 1. SCD's given plts <100k 5. Hypocalcemia 1. Cont home oral replacement  Code Status: Full (must indicate code status--if unknown or must be presumed, indicate so) Family Communication: Pt in room (indicate person spoken with, if applicable, with phone number if by telephone) Disposition Plan: Pending (indicate anticipated LOS)  Time spent: 40min  Jerald KiefStephen K Chiu [facility] Pager [phone]  If 7PM-7AM, please contact night-coverage [email or web address] Password [email or web address] 03/01/2014, 4:39 PM

## 2014-03-01 NOTE — ED Notes (Signed)
Calcium 6.0 called from the lab, dr Effie Shywentz aware

## 2014-03-01 NOTE — ED Notes (Signed)
He states his nephrologist sent him today for abnormal kidney function. States they have been monitoring his renal function in office and "its getting worse." he c/o nausea and loss of appetite over past week

## 2014-03-01 NOTE — Progress Notes (Signed)
Pt admitted to room 6E 11 from Ed via stretcher without difficulty.  Pt A&OX4.  Pleasant.  Denies pain at this time, but states he has intermitant stomach pain. Pt oriented to room, call bell, bed.

## 2014-03-01 NOTE — ED Provider Notes (Signed)
CSN: [id number]     Arrival date & time 03/01/14  1230 History   First MD Initiated Contact with Patient 03/01/14 1502     Chief Complaint  Patient presents with  . Emesis     (Consider location/radiation/quality/duration/timing/severity/associated sxs/prior Treatment) HPI  This is a 38 y.o. male with past medical history of hypertension/, hyperlipidemia, CTD (seen by [facility], with hemodialysis catheter in place, but not in use), [facility] due to increase in his creatinine.  His creatinine for has increased from previous value of 18 to 25. Subjectively, when speaking to the patient he complains of nothing but nausea and vomiting. Onset 2 weeks ago, at home. Persistent, unable to keep by mouth down except for small amounts of water. Patient is not been taking promethazine prescribed by nephrology. The patient denies chest pain shortness of breath abdominal pain. Positive for diarrhea. Negative for hematochezia, melena, joint or extremity swelling.  Past Medical History  Diagnosis Date  . Chronic kidney disease   . Hypertension   . Hyperlipidemia   . Anemia   . PONV (postoperative nausea and vomiting)    Past Surgical History  Procedure Laterality Date  . Wisdom tooth extraction      Hx; of  . Capd insertion N/A 01/30/2014    Procedure: LAPAROSCOPIC INSERTION CONTINUOUS AMBULATORY PERITONEAL DIALYSIS CATHETER;  Surgeon: Axel FillerArmando Ramirez, MD;  Location: [facility] OR;  Service: General;  Laterality: N/A;  . Laparoscopy N/A 02/07/2014    Procedure: LAPAROSCOPY DIAGNOSTIC; REINSERTION OF CAPD ;  Surgeon: Axel FillerArmando Ramirez, MD;  Location: [facility] OR;  Service: General;  Laterality: N/A;   Family History  Problem Relation Age of Onset  . Hypertension Mother   . Hypertension Father    History  Substance Use Topics  . Smoking status: Never Smoker   . Smokeless tobacco: Never Used  . Alcohol Use: No    Review of Systems  Constitutional: Negative for  fever and chills.  HENT: Negative for facial swelling.   Eyes: Negative for pain and visual disturbance.  Respiratory: Negative for chest tightness and shortness of breath.   Cardiovascular: Negative for chest pain.  Gastrointestinal: Positive for nausea, vomiting and diarrhea.  Genitourinary: Negative for dysuria.  Musculoskeletal: Negative for arthralgias and myalgias.  Neurological: Negative for headaches.  Psychiatric/Behavioral: Negative for behavioral problems.      Allergies  Review of patient's allergies indicates no known allergies.  Home Medications   Prior to Admission medications   Medication Sig Start Date End Date Taking? Authorizing Provider  amLODipine (NORVASC) 10 MG tablet Take 10 mg by mouth daily.   Yes Historical Provider, MD  calcitRIOL (ROCALTROL) 0.25 MCG capsule Take 0.25 mcg by mouth daily. 01/23/14  Yes Historical Provider, MD  calcium carbonate (TUMS - DOSED IN MG ELEMENTAL CALCIUM) 500 MG chewable tablet Chew 1 tablet by mouth daily.   Yes Historical Provider, MD  Choline Fenofibrate (TRILIPIX) 135 MG capsule Take 135 mg by mouth daily.   Yes Historical Provider, MD  epoetin alfa (EPOGEN,PROCRIT) 1191420000 UNIT/ML injection Inject 20,000 Units into the skin every 14 (fourteen) days.   Yes Historical Provider, MD  furosemide (LASIX) 80 MG tablet Take 80 mg by mouth 2 (two) times daily.   Yes Historical Provider, MD  metoprolol succinate (TOPROL-XL) 50 MG 24 hr tablet Take 50 mg by mouth daily. Take with or immediately following a meal.   Yes Historical Provider, MD   BP 128/88  Pulse 73  Temp(Src) 98.6 F (  37 C) (Oral)  Resp 15  Ht 5\' 10"  (1.778 m)  Wt 194 lb 1.6 oz (88.043 kg)  BMI 27.85 kg/m2  SpO2 100% Physical Exam  Constitutional: He is oriented to person, place, and time. He appears well-developed and well-nourished. No distress.  HENT:  Head: Normocephalic and atraumatic.  Mouth/Throat: No oropharyngeal exudate.  Eyes: Conjunctivae are normal.  Pupils are equal, round, and reactive to light. No scleral icterus.  Neck: Normal range of motion. No tracheal deviation present. No thyromegaly present.  Cardiovascular: Normal rate, regular rhythm and normal heart sounds.  Exam reveals no gallop and no friction rub.   No murmur heard. Pulmonary/Chest: Effort normal and breath sounds normal. No stridor. No respiratory distress. He has no wheezes. He has no rales. He exhibits no tenderness.  Abdominal: Soft. He exhibits no distension and no mass. There is no tenderness. There is no rebound and no guarding.  Musculoskeletal: Normal range of motion. He exhibits no edema.  Neurological: He is alert and oriented to person, place, and time.  Skin: Skin is warm and dry. He is not diaphoretic.    ED Course  Procedures (including critical care time) Labs Review Labs Reviewed  CBC WITH DIFFERENTIAL - Abnormal; Notable for the following:    WBC 1.9 (*)    RBC 3.35 (*)    Hemoglobin 8.9 (*)    HCT 26.2 (*)    RDW 15.6 (*)    Platelets 99 (*)    Basophils Relative 3 (*)    Neutro Abs 1.0 (*)    Lymphs Abs 0.5 (*)    All other components within normal limits  COMPREHENSIVE METABOLIC PANEL - Abnormal; Notable for the following:    Chloride 90 (*)    CO2 16 (*)    Glucose, Bld 107 (*)    BUN 242 (*)    Creatinine, Ser 24.49 (*)    Calcium 6.0 (*)    Albumin 3.4 (*)    AST 178 (*)    ALT 141 (*)    GFR calc non Af Amer 2 (*)    GFR calc Af Amer 2 (*)    All other components within normal limits  URINALYSIS, ROUTINE W REFLEX MICROSCOPIC - Abnormal; Notable for the following:    Hgb urine dipstick SMALL (*)    Protein, ur 100 (*)    All other components within normal limits  URINE MICROSCOPIC-ADD ON    EKG Interpretation   Date/Time:  Friday March 01 2014 14:57:12 EDT Ventricular Rate:  72 PR Interval:  169 QRS Duration: 97 QT Interval:  472 QTC Calculation: 517 R Axis:   64 Text Interpretation:  Sinus rhythm Prolonged QT  interval Sinus rhythm QT  prolonged Non-specific intra-ventricular conduction delay Abnormal ekg  Confirmed by Gerhard Munch  MD [phone]) on 03/01/2014 3:37:04 PM      MDM   Final diagnoses:  None    This is a 38 y.o. male with past medical history of hypertension/, hyperlipidemia, CTD (seen by [facility], with hemodialysis catheter in place, but not in use), [facility] due to increase in his creatinine.  His creatinine for has increased from previous value of 18 to 25. Subjectively, when speaking to the patient he complains of nothing but nausea and vomiting. Onset 2 weeks ago, at home. Persistent, unable to keep by mouth down except for small amounts of water. Patient is not been taking promethazine prescribed by nephrology. The patient denies chest pain  shortness of breath abdominal pain. Positive for diarrhea. Negative for hematochezia, melena, joint or extremity swelling.  Please refer to Dr. Priscille LovelessLockwood's EKG interpretation above.  Examination reveals normal vital signs. There are no signs of fluid overload. Patient's creatinine is significantly increased, as well is his BUN. However his potassium is stable and within normal limits at this time. Patient has no evidence of intoxication.  His bicarbonate on metabolic panel is 16. Not critical at this time; however, I have ordered a venous blood gas to ensure that he is not currently acidotic.  I've spoken with Dr. Marisue HumbleSanford with [facility]. He recommends admission and dialysis. I've contacted [facility] to admit the patient. He currently remains stable. I will administer Zofran for emesis at this time and continue to monitor.  Patient is being admitted in stable condition.  I have discussed case and care has been guided by my attending physician, Dr. Jeraldine LootsLockwood.    Loma BostonStirling Leeana Creer, MD 03/02/14 [phone]

## 2014-03-02 ENCOUNTER — Inpatient Hospital Stay (HOSPITAL_COMMUNITY): Payer: Managed Care, Other (non HMO)

## 2014-03-02 DIAGNOSIS — E440 Moderate protein-calorie malnutrition: Secondary | ICD-10-CM | POA: Insufficient documentation

## 2014-03-02 LAB — COMPREHENSIVE METABOLIC PANEL
ALK PHOS: 39 U/L (ref 39–117)
ALT: 137 U/L — ABNORMAL HIGH (ref 0–53)
AST: 174 U/L — AB (ref 0–37)
Albumin: 3.1 g/dL — ABNORMAL LOW (ref 3.5–5.2)
BUN: 235 mg/dL — ABNORMAL HIGH (ref 6–23)
CALCIUM: 6.1 mg/dL — AB (ref 8.4–10.5)
CO2: 19 mEq/L (ref 19–32)
Chloride: 93 mEq/L — ABNORMAL LOW (ref 96–112)
Creatinine, Ser: 24.64 mg/dL — ABNORMAL HIGH (ref 0.50–1.35)
GFR calc Af Amer: 2 mL/min — ABNORMAL LOW (ref >90)
GFR calc non Af Amer: 2 mL/min — ABNORMAL LOW (ref >90)
Glucose, Bld: 110 mg/dL — ABNORMAL HIGH (ref 70–99)
POTASSIUM: 3.9 meq/L (ref 3.7–5.3)
Sodium: 140 mEq/L (ref 137–147)
TOTAL PROTEIN: 6.1 g/dL (ref 6.0–8.3)
Total Bilirubin: 0.5 mg/dL (ref 0.3–1.2)

## 2014-03-02 LAB — RETICULOCYTES
RBC.: 3.46 MIL/uL — ABNORMAL LOW (ref 4.22–5.81)
RETIC CT PCT: 0.7 % (ref 0.4–3.1)
Retic Count, Absolute: 24.2 10*3/uL (ref 19.0–186.0)

## 2014-03-02 LAB — CBC
HEMATOCRIT: 27.3 % — AB (ref 39.0–52.0)
HEMOGLOBIN: 9.1 g/dL — AB (ref 13.0–17.0)
MCH: 26.3 pg (ref 26.0–34.0)
MCHC: 33.3 g/dL (ref 30.0–36.0)
MCV: 78.9 fL (ref 78.0–100.0)
Platelets: 87 10*3/uL — ABNORMAL LOW (ref 150–400)
RBC: 3.46 MIL/uL — ABNORMAL LOW (ref 4.22–5.81)
RDW: 15.7 % — AB (ref 11.5–15.5)
WBC: 1.8 10*3/uL — ABNORMAL LOW (ref 4.0–10.5)

## 2014-03-02 LAB — BODY FLUID CELL COUNT WITH DIFFERENTIAL
EOS FL: 14 %
Lymphs, Fluid: 74 %
Monocyte-Macrophage-Serous Fluid: 8 %
NEUTROPHIL FLUID: 4 %
Total Nucleated Cell Count, Fluid: 41 /mm3

## 2014-03-02 LAB — IRON AND TIBC
Iron: 137 ug/dL — ABNORMAL HIGH (ref 42–135)
Saturation Ratios: 54 % (ref 20–55)
TIBC: 255 ug/dL (ref 215–435)
UIBC: 118 ug/dL — AB (ref 125–400)

## 2014-03-02 LAB — FERRITIN: Ferritin: 2618 ng/mL — ABNORMAL HIGH (ref 22–322)

## 2014-03-02 LAB — VITAMIN B12: Vitamin B-12: 1724 pg/mL — ABNORMAL HIGH (ref 211–911)

## 2014-03-02 LAB — PHOSPHORUS: PHOSPHORUS: 11.2 mg/dL — AB (ref 2.3–4.6)

## 2014-03-02 MED ORDER — PROMETHAZINE HCL 25 MG/ML IJ SOLN
25.0000 mg | Freq: Once | INTRAMUSCULAR | Status: AC
  Administered 2014-03-02: 25 mg via INTRAVENOUS
  Filled 2014-03-02: qty 1

## 2014-03-02 MED ORDER — NEPRO/CARBSTEADY PO LIQD
237.0000 mL | Freq: Two times a day (BID) | ORAL | Status: DC
  Administered 2014-03-05 (×2): 237 mL via ORAL

## 2014-03-02 MED ORDER — HEPARIN SODIUM (PORCINE) 1000 UNIT/ML IJ SOLN: 500.0000 [IU] | INTRAMUSCULAR | Status: DC

## 2014-03-02 MED ORDER — DELFLEX-LM/1.5% DEXTROSE 346 MOSM/L IP SOLN: INTRAPERITONEAL | Status: DC

## 2014-03-02 MED ORDER — HEPARIN SODIUM (PORCINE) 1000 UNIT/ML IJ SOLN
5000.0000 [IU] | INTRAMUSCULAR | Status: DC
  Filled 2014-03-02 (×2): qty 5

## 2014-03-02 MED ORDER — HEPARIN SODIUM (PORCINE) 1000 UNIT/ML IJ SOLN
2500.0000 [IU] | INTRAMUSCULAR | Status: DC
  Administered 2014-03-02 – 2014-03-03 (×3): 2500 [IU] via INTRAPERITONEAL
  Administered 2014-03-05 (×2): 1500 [IU] via INTRAPERITONEAL
  Filled 2014-03-02 (×10): qty 2.5

## 2014-03-02 MED ORDER — CALCIUM CARBONATE ANTACID 500 MG PO CHEW
800.0000 mg | CHEWABLE_TABLET | Freq: Three times a day (TID) | ORAL | Status: DC
  Administered 2014-03-02 – 2014-03-05 (×9): 800 mg via ORAL
  Filled 2014-03-02 (×14): qty 4

## 2014-03-02 MED ORDER — POLYETHYLENE GLYCOL 3350 17 G PO PACK
17.0000 g | PACK | Freq: Every day | ORAL | Status: DC
  Administered 2014-03-05: 17 g via ORAL
  Filled 2014-03-02 (×5): qty 1

## 2014-03-02 NOTE — Progress Notes (Signed)
[facility] PROGRESS NOTE  Georgeanna LeaMichael Bos ZOX:[id number] DOB: [birth year] DOA: 03/01/2014 PCP: Cecille AverGOLDSBOROUGH,KELLIE A, MD  Assessment/Plan: Chronic kidney disease (CKD), stage IV (severe)/ESRD with uremia  -Peritoneal dialysis initiated per renal -Appreciate the assistance, follow. Active Problems:  Pancytopenia -Await pleural fluid culture to eval for infection -Followup on B12 and pending iron levels -He is hemodynamically stable, follow Hypocalcemia -Continue supplemental calcium Moderate malnutrition-in the setting of chronic illness -Appreciate dietary input, continue nutritional supplements Code Status: Full Family Communication: None at bedside Disposition Plan: to home when medically ready   Consultants:   renal  Procedures:   none  Antibiotics:   none  HPI/Subjective: Nausea and vomiting earlier today but now better.  Objective: Filed Vitals:   03/02/14 1300  BP: 120/80  Pulse: 60  Temp:   Resp: 13    Intake/Output Summary (Last 24 hours) at 03/02/14 1713 Last data filed at 03/02/14 0900  Gross per 24 hour  Intake    120 ml  Output      0 ml  Net    120 ml   Filed Weights   03/01/14 1239 03/02/14 0427 03/02/14 1021  Weight: 88.043 kg (194 lb 1.6 oz) 88.043 kg (194 lb 1.6 oz) 88.043 kg (194 lb 1.6 oz)    Exam:  General: alert & or sleepy but easily aroused,  In NAD Cardiovascular: RRR, nl S1 s2 Respiratory: CTAB Abdomen: soft +BS NT/ND, no masses palpable Extremities: No cyanosis and no edema    Data Reviewed: Basic Metabolic Panel:  Recent Labs Lab 03/01/14 1242 03/01/14 1710 03/02/14 0520  NA 138  --  140  K 3.9  --  3.9  CL 90*  --  93*  CO2 16*  --  19  GLUCOSE 107*  --  110*  BUN 242*  --  235*  CREATININE 24.49* 26.42* 24.64*  CALCIUM 6.0*  --  6.1*  PHOS  --   --  11.2*   Liver Function Tests:  Recent Labs Lab 03/01/14 1242 03/02/14 0520  AST 178* 174*  ALT 141* 137*  ALKPHOS 44 39  BILITOT 0.6 0.5  PROT  6.7 6.1  ALBUMIN 3.4* 3.1*   No results found for this basename: LIPASE, AMYLASE,  in the last 168 hours No results found for this basename: AMMONIA,  in the last 168 hours CBC:  Recent Labs Lab 03/01/14 1242 03/01/14 1710 03/02/14 0520  WBC 1.9* 1.6* 1.8*  NEUTROABS 1.0*  --   --   HGB 8.9* 8.4* 9.1*  HCT 26.2* 25.6* 27.3*  MCV 78.2 79.8 78.9  PLT 99* 73* 87*   Cardiac Enzymes: No results found for this basename: CKTOTAL, CKMB, CKMBINDEX, TROPONINI,  in the last 168 hours BNP (last 3 results) No results found for this basename: PROBNP,  in the last 8760 hours CBG: No results found for this basename: GLUCAP,  in the last 168 hours  No results found for this or any previous visit (from the past 240 hour(s)).   Studies: No results found.  Scheduled Meds: . amLODipine  10 mg Oral Daily  . calcitRIOL  0.5 mcg Oral Daily  . calcium carbonate  800 mg of elemental calcium Oral TID  . feeding supplement (NEPRO CARB STEADY)  237 mL Oral BID WC  . fenofibrate  160 mg Oral Daily  . metoprolol succinate  50 mg Oral Daily  . ondansetron (ZOFRAN) IV  4 mg Intravenous Once  . polyethylene glycol  17 g Oral Daily   Continuous Infusions: .  dialysis solution 1.5% low-MG    . heparin      Principal Problem:   ARF (acute renal failure) Active Problems:   Chronic kidney disease (CKD), stage IV (severe)   AKI (acute kidney injury)   Malnutrition of moderate degree    Time spent:35    Kela MillinAdeline C Haruka Kowaleski  [facility] Pager [phone]. If 7PM-7AM, please contact night-coverage at [email or web address], password [email or web address] 03/02/2014, 5:13 PM  LOS: 1 day

## 2014-03-02 NOTE — Progress Notes (Signed)
INITIAL NUTRITION ASSESSMENT  DOCUMENTATION CODES Per approved criteria  -Non-severe (moderate) malnutrition in the context of Chronic Illness    Pt meets criteria for non-severe MALNUTRITION in the context of chronic illness as evidenced by weight loss of 5%/ 1 month and energy intake <75% for >/= 1 month.   INTERVENTION: Nepro Shake BID, each supplement providing 425 kcal and 19 grams of protein   NUTRITION DIAGNOSIS: Inadequate oral intake related to poor appetite as evidenced by pt report of consuming 0-25% of meal intake for about 1 month.   Goal: Meet >/=90% of estimated nutrition needs   Monitor:  PO intake, supplement acceptance, labs, weight trends   Reason for Assessment: Positive Malnutrition Screening Tool Score   38 y.o. male  Admitting Dx: ARF (acute renal failure)  ASSESSMENT: 38 y.o. male with a hx of stage 4 ckd, htn, hld, and anemia who presents to the ED with worsening renal failure (baseline Cr one month ago was 18, now 25). The patient was subsequently referred to the ED for further management.  Pt began CCPD 4/17.   Pt states that he has lost 10-15 pounds in the past two weeks. He explained that he weighed 205 lbs two weeks ago and now weighs 194 lbs. This is consistent with his chart in [facility]. This is a 5% weight loss in about 1 month which is clinically significant.   Pt has not had an appetite and states that he hasn't eaten or drank anything in the past two weeks. Prior to this he was eating 1 meal/day.   Nutrition focused physical exam did not reveal any depletion of muscle mass or body fat. However, pt states that his pants are fitting differently.   Encouraged patient to eat as much as he can because of his increased needs starting CCPD on 4/17. Pt stated that he likes soul food and does not like the hospital food with no salt or seasoning. Explained to pt that he needs to watch his sodium because of his kidney failure. Pt may need more education on  renal diet with dialysis.   Asked pt to try Nepro Shake, and pt stated he doesn't like milk or vanilla but said he would try it. If pt does not tolerate, look at other options for supplementation.   Elevated Glucose, BUN, Cr, Phosphorus   Height: Ht Readings from Last 1 Encounters:  03/02/14 5\' 11"  (1.803 m)    Weight: Wt Readings from Last 1 Encounters:  03/02/14 194 lb 1.6 oz (88.043 kg)    Ideal Body Weight: 172 lbs   % Ideal Body Weight: 113%   Wt Readings from Last 10 Encounters:  03/02/14 194 lb 1.6 oz (88.043 kg)  02/10/14 195 lb 8 oz (88.678 kg)  02/07/14 206 lb 14 oz (93.838 kg)  02/07/14 206 lb 14 oz (93.838 kg)  01/25/14 206 lb 14.4 oz (93.849 kg)  12/11/13 210 lb (95.255 kg)  10/02/13 205 lb (92.987 kg)  08/12/13 196 lb 6.4 oz (89.086 kg)  07/23/13 198 lb (89.812 kg)  06/29/13 198 lb (89.812 kg)    Usual Body Weight: 205 lbs   % Usual Body Weight: 95%   BMI:  Body mass index is 27.08 kg/(m^2).  Estimated Nutritional Needs: Kcal: 2800-3000 Protein: >/= 105 g  Fluid: 1.2 L   Skin: WDL   Diet Order: Renal  EDUCATION NEEDS: -No education needs identified at this time    Intake/Output Summary (Last 24 hours) at 03/02/14 1441 Last data filed at  03/02/14 0900  Gross per 24 hour  Intake    120 ml  Output      0 ml  Net    120 ml    Last BM: 4/17    Labs:   Recent Labs Lab 03/01/14 1242 03/01/14 1710 03/02/14 0520  NA 138  --  140  K 3.9  --  3.9  CL 90*  --  93*  CO2 16*  --  19  BUN 242*  --  235*  CREATININE 24.49* 26.42* 24.64*  CALCIUM 6.0*  --  6.1*  PHOS  --   --  11.2*  GLUCOSE 107*  --  110*    CBG (last 3)  No results found for this basename: GLUCAP,  in the last 72 hours  Scheduled Meds: . amLODipine  10 mg Oral Daily  . calcitRIOL  0.5 mcg Oral Daily  . calcium carbonate  800 mg of elemental calcium Oral TID  . fenofibrate  160 mg Oral Daily  . metoprolol succinate  50 mg Oral Daily  . ondansetron (ZOFRAN) IV  4  mg Intravenous Once  . polyethylene glycol  17 g Oral Daily    Continuous Infusions: . dialysis solution 1.5% low-MG    . heparin      Past Medical History  Diagnosis Date  . Chronic kidney disease   . Hypertension   . Hyperlipidemia   . Anemia   . PONV (postoperative nausea and vomiting)     Past Surgical History  Procedure Laterality Date  . Wisdom tooth extraction      Hx; of  . Capd insertion N/A 01/30/2014    Procedure: LAPAROSCOPIC INSERTION CONTINUOUS AMBULATORY PERITONEAL DIALYSIS CATHETER;  Surgeon: Axel FillerArmando Ramirez, MD;  Location: [facility] OR;  Service: General;  Laterality: N/A;  . Laparoscopy N/A 02/07/2014    Procedure: LAPAROSCOPY DIAGNOSTIC; REINSERTION OF CAPD ;  Surgeon: Axel FillerArmando Ramirez, MD;  [facility];  Service: General;  Laterality: N/A;    Eppie Gibsonebekah L Jahmal Dunavant, BS Nutrition Intern Pager: [phone]

## 2014-03-02 NOTE — Progress Notes (Signed)
6:17 AM 03/02/2014  Patient has been unable to drain in the 20 minutes that has been programed on the Cycler. Spent an estimated 2 hours on his first drain and still had not completely emptied. Informed on call Renal Briant Cedar([place]) who gave me orders to aspirate the catheter and then place Heparin into both bags (check eMAR) and then bypass drain and begin to refill him again. After discussing this with Thermon LeylandKim Gengler and Burley SaverKami Moore that we are not trained to aspirate catheters the MD was made aware and still gave the go ahead to instill the Heparin additive. Then stated if this does not help they would look at the issue in the morning when they rounded. Pharmacy was called to help place the Heparin order correctly. Patient received Heparin and machine was set to fill number 2. Will continue to assess and monitor patient and make the day shift RN aware of the progress throughout the night.  GoshenRyanne Hill  [facility] [place] [phone] (late entry: occurred around 3 am)

## 2014-03-02 NOTE — Procedures (Signed)
I was present at this dialysis session. I have reviewed the session itself and made appropriate changes.   PD fluid clean, no evidence of infection  Pt with prolonged outflow time.  Added heparin 500u/L to all bags, still taking some time but perhas draining more quickly this AM.  Visible fibrin clots in PD fluid this AM.  No fluid on dressing. Abd w/o edema.  States having BMs but not sure about that.  Plan 1. Miralax Daily 2. AXR to eval position 3. Heparin 500u/L in all bags  4. Same PD Rx tonight 5. Check Phos 6. Make PO Ca in between meals (800mg  elemental Ca)  Lawrence Heckyan Roylee Chaffin  MD 03/02/2014, 10:18 AM

## 2014-03-02 NOTE — Progress Notes (Signed)
Agree with dietetic intern note. Promiss Labarbera Barnett RD, LDN Pager: [phone] After Hours Pager: [phone]  

## 2014-03-02 NOTE — Progress Notes (Signed)
CRITICAL VALUE ALERT  Critical value received:  Ca 6.1  Date of notification:  03/02/2014  Time of notification:  0620  Critical value read back:yes  Nurse who received alert:  Doristine Devoidyanne Hill RN  MD notified (1st page):  Elray McgregorMary Lynch  Time of first page:  0625  MD notified (2nd page):  Time of second page:  Responding MD:  Elray McgregorMary Lynch  Time MD responded:  [phone]

## 2014-03-02 NOTE — ED Provider Notes (Signed)
This patient was seen in conjunction with the resident physician, Dr. Clearance CootsHarper.  The documentation accurately reflects the patient's encounter in the Emergency Department.  On my exam, patient was sleeping. The patient's creatinine was elevated, beyond already abnormal level. After the initial evaluation we discussed patient's case with nephrology, and the patient was admitted for a dialysis.  I reviewed EKG interpretation  Gerhard Munchobert Kervin Bones, MD 03/02/14 [phone]

## 2014-03-03 LAB — CBC WITH DIFFERENTIAL/PLATELET
BASOS PCT: 2 % — AB (ref 0–1)
Basophils Absolute: 0 10*3/uL (ref 0.0–0.1)
Eosinophils Absolute: 0.1 10*3/uL (ref 0.0–0.7)
Eosinophils Relative: 5 % (ref 0–5)
HEMATOCRIT: 27.4 % — AB (ref 39.0–52.0)
Hemoglobin: 9.1 g/dL — ABNORMAL LOW (ref 13.0–17.0)
LYMPHS ABS: 0.4 10*3/uL — AB (ref 0.7–4.0)
LYMPHS PCT: 22 % (ref 12–46)
MCH: 26.1 pg (ref 26.0–34.0)
MCHC: 33.2 g/dL (ref 30.0–36.0)
MCV: 78.5 fL (ref 78.0–100.0)
MONOS PCT: 16 % — AB (ref 3–12)
Monocytes Absolute: 0.3 10*3/uL (ref 0.1–1.0)
Neutro Abs: 1.1 10*3/uL — ABNORMAL LOW (ref 1.7–7.7)
Neutrophils Relative %: 55 % (ref 43–77)
Platelets: 135 10*3/uL — ABNORMAL LOW (ref 150–400)
RBC: 3.49 MIL/uL — AB (ref 4.22–5.81)
RDW: 15.5 % (ref 11.5–15.5)
WBC: 1.9 10*3/uL — ABNORMAL LOW (ref 4.0–10.5)

## 2014-03-03 LAB — BASIC METABOLIC PANEL
BUN: 222 mg/dL — ABNORMAL HIGH (ref 6–23)
CHLORIDE: 93 meq/L — AB (ref 96–112)
CO2: 20 meq/L (ref 19–32)
CREATININE: 24.12 mg/dL — AB (ref 0.50–1.35)
Calcium: 6.5 mg/dL — ABNORMAL LOW (ref 8.4–10.5)
GFR calc non Af Amer: 2 mL/min — ABNORMAL LOW (ref >90)
GFR, EST AFRICAN AMERICAN: 2 mL/min — AB (ref >90)
Glucose, Bld: 135 mg/dL — ABNORMAL HIGH (ref 70–99)
POTASSIUM: 3.9 meq/L (ref 3.7–5.3)
SODIUM: 141 meq/L (ref 137–147)

## 2014-03-03 MED ORDER — CALCIUM ACETATE 667 MG PO CAPS
1334.0000 mg | ORAL_CAPSULE | Freq: Three times a day (TID) | ORAL | Status: DC
  Administered 2014-03-03 – 2014-03-04 (×3): 1334 mg via ORAL
  Filled 2014-03-03 (×9): qty 2

## 2014-03-03 MED ORDER — DELFLEX-LM/1.5% DEXTROSE 346 MOSM/L IP SOLN: INTRAPERITONEAL | Status: DC

## 2014-03-03 MED ORDER — HEPARIN SODIUM (PORCINE) 1000 UNIT/ML IJ SOLN: 500.0000 [IU] | INTRAMUSCULAR | Status: DC

## 2014-03-03 MED ORDER — DARBEPOETIN ALFA-POLYSORBATE 100 MCG/0.5ML IJ SOLN
100.0000 ug | INTRAMUSCULAR | Status: DC
Start: 2014-03-03 — End: 2014-03-06
  Administered 2014-03-03: 100 ug via SUBCUTANEOUS
  Filled 2014-03-03: qty 0.5

## 2014-03-03 NOTE — Progress Notes (Signed)
[facility] PROGRESS NOTE  Georgeanna LeaMichael Stroope [id number] DOB: Jul 11, 1976 DOA: 03/01/2014 PCP: Cecille AverGOLDSBOROUGH,KELLIE A, MD  Assessment/Plan: Chronic kidney disease (CKD), stage IV (severe)/ESRD with uremia  -Peritoneal dialysis initiated per renal on admission -continue PD per renal Active Problems:  Pancytopenia -peritoneal fluid culture with no growth x1day -Followup B12 and  iron levels elevated -He is hemodynamically stable, follow Hypocalcemia -Continue supplemental calcium Moderate malnutrition-in the setting of chronic illness -Appreciate dietary input, continue nutritional supplements Code Status: Full Family Communication: None at bedside Disposition Plan: to home when medically ready   Consultants:   renal  Procedures:   none  Antibiotics:   none  HPI/Subjective: Looks better today, more alert and denies n/v  Objective: Filed Vitals:   03/03/14 1300  BP: 133/88  Pulse: 65  Temp: 98.2 F (36.8 C)  Resp: 17    Intake/Output Summary (Last 24 hours) at 03/03/14 1635 Last data filed at 03/03/14 1500  Gross per 24 hour  Intake    360 ml  Output      0 ml  Net    360 ml   Filed Weights   03/02/14 0427 03/02/14 1021 03/02/14 1700  Weight: 88.043 kg (194 lb 1.6 oz) 88.043 kg (194 lb 1.6 oz) 86.5 kg (190 lb 11.2 oz)    Exam:  General: alert & or sleepy but easily aroused,  In NAD Cardiovascular: RRR, nl S1 s2 Respiratory: CTAB Abdomen: soft +BS NT/ND, no masses palpable Extremities: No cyanosis and no edema    Data Reviewed: Basic Metabolic Panel:  Recent Labs Lab 03/01/14 1242 03/01/14 1710 03/02/14 0520 03/03/14 0750  NA 138  --  140 141  K 3.9  --  3.9 3.9  CL 90*  --  93* 93*  CO2 16*  --  19 20  GLUCOSE 107*  --  110* 135*  BUN 242*  --  235* 222*  CREATININE 24.49* 26.42* 24.64* 24.12*  CALCIUM 6.0*  --  6.1* 6.5*  PHOS  --   --  11.2*  --    Liver Function Tests:  Recent Labs Lab 03/01/14 1242 03/02/14 0520  AST  178* 174*  ALT 141* 137*  ALKPHOS 44 39  BILITOT 0.6 0.5  PROT 6.7 6.1  ALBUMIN 3.4* 3.1*   No results found for this basename: LIPASE, AMYLASE,  in the last 168 hours No results found for this basename: AMMONIA,  in the last 168 hours CBC:  Recent Labs Lab 03/01/14 1242 03/01/14 1710 03/02/14 0520 03/03/14 0750  WBC 1.9* 1.6* 1.8* 1.9*  NEUTROABS 1.0*  --   --  1.1*  HGB 8.9* 8.4* 9.1* 9.1*  HCT 26.2* 25.6* 27.3* 27.4*  MCV 78.2 79.8 78.9 78.5  PLT 99* 73* 87* 135*   Cardiac Enzymes: No results found for this basename: CKTOTAL, CKMB, CKMBINDEX, TROPONINI,  in the last 168 hours BNP (last 3 results) No results found for this basename: PROBNP,  in the last 8760 hours CBG: No results found for this basename: GLUCAP,  in the last 168 hours  Recent Results (from the past 240 hour(s))  BODY FLUID CULTURE     Status: None   Collection Time    03/02/14  2:33 AM      Result Value Ref Range Status   Specimen Description FLUID PERITONEAL   Final   Special Requests NONE   Final   Gram Stain     Final   Value: WBC PRESENT, PREDOMINANTLY PMN     NO ORGANISMS SEEN  Performed at [facility]   Culture     Final   Value: NO GROWTH 1 DAY     [facility]formed at [facility]   Report Status PENDING   Incomplete     Studies: Dg Abd 1 View  03/02/2014   CLINICAL DATA:  Peritoneal dialysis catheter placement  EXAM: ABDOMEN - 1 VIEW  COMPARISON:  None.  FINDINGS: The catheter tip region is coiled in the pelvis. Bowel gas pattern is unremarkable. No obstruction or free air is seen on this supine examination. There is moderate stool in the colon. There are phleboliths in the pelvis.  IMPRESSION: The distal dialysis catheter is positioned in the mid-pelvis. Bowel gas pattern unremarkable.   Electronically Signed   By: Bretta BangWilliam  Woodruff M.D.   On: 03/02/2014 19:18    Scheduled Meds: . amLODipine  10 mg Oral Daily  . calcitRIOL  0.5 mcg Oral Daily  . calcium acetate  1,334 mg  Oral TID WC  . calcium carbonate  800 mg of elemental calcium Oral TID  . darbepoetin (ARANESP) injection - NON-DIALYSIS  100 mcg Subcutaneous Q Sun-1800  . feeding supplement (NEPRO CARB STEADY)  237 mL Oral BID WC  . fenofibrate  160 mg Oral Daily  . metoprolol succinate  50 mg Oral Daily  . ondansetron (ZOFRAN) IV  4 mg Intravenous Once  . polyethylene glycol  17 g Oral Daily   Continuous Infusions: . dialysis solution 1.5% low-MG    . heparin      Principal Problem:   ARF (acute renal failure) Active Problems:   Chronic kidney disease (CKD), stage IV (severe)   AKI (acute kidney injury)   Malnutrition of moderate degree    Time spent:25    Kela MillinAdeline C Makayle Krahn  [facility] Pager [phone]. If 7PM-7AM, please contact night-coverage at [email or web address], password [email or web address] 03/03/2014, 4:35 PM  LOS: 2 days

## 2014-03-03 NOTE — Progress Notes (Signed)
Admit: 03/01/2014 LOS: 2  89M presenting with uremia, azotemia, and new start dialysis with PD catheter placement on 02/07/14.  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 rate 16, height 5\' 11"  (1.803 m), weight 86.5 kg (190 lb 11.2 oz), SpO2 100.00%. NAD RRR CTAB, nl wob abd s/nt/nd +bs.  PD Cath site c/d/i Trace pitting edema No rashes Nonfocal, aaox3  Assessment 1. CKD-5PD, new ESRD 2. Uremia, improving 3. Leukopenia / TCP 4. Hypocalcemia, ASx 5. Metabolic Acidosis, improving 6. Anemia, B12 and iron replete, retic count low 7. Hyperphosphatemia  Plan 1. Plan to cont low volume supine PD, slighly increase settings to 1.5L 1.5% 1h exchanges x6 2. Heparin in all bags again tonight 3. Cont VDRA, PO CaCO3 inbetween meals 4. Cont miralax to achieve daily BM 5. Start Aranesp qSun 6. CBC with diff today 7. Renal panel and CBC with diff daily 8. Start PhosLo 2qAC  Sabra Heckyan Tephanie Escorcia MD 03/03/2014, 11:26 AM   Recent Labs Lab 03/01/14 1242 03/01/14 1710 03/02/14 0520 03/03/14 0750  NA 138  --  140 141  K 3.9  --  3.9 3.9  CL 90*  --  93* 93*  CO2 16*  --  19 20  GLUCOSE 107*  --  110* 135*  BUN 242*  --  235* 222*  CREATININE 24.49* 26.42* 24.64* 24.12*  CALCIUM 6.0*  --  6.1* 6.5*  PHOS  --   --  11.2*  --     Recent Labs Lab 03/01/14 1242 03/01/14 1710 03/02/14 0520  WBC 1.9* 1.6* 1.8*  NEUTROABS 1.0*  --   --   HGB 8.9* 8.4* 9.1*  HCT 26.2* 25.6*  27.3*  MCV 78.2 79.8 78.9  PLT 99* 73* 87*   Current Facility-Administered Medications  Medication Dose Route Frequency Provider Last Rate Last Dose  . acetaminophen (TYLENOL) tablet 650 mg  650 mg Oral Q6H PRN Jerald KiefStephen K Chiu, MD       Or  . acetaminophen (TYLENOL) suppository 650 mg  650 mg Rectal Q6H PRN Jerald KiefStephen K Chiu, MD      . amLODipine (NORVASC) tablet 10 mg  10 mg Oral Daily Jerald KiefStephen K Chiu, MD   10 mg at 03/03/14 1117  . calcitRIOL (ROCALTROL) capsule 0.5 mcg  0.5 mcg Oral Daily Arita Missyan B Nickoli Bagheri, MD   0.5 mcg at 03/03/14 1118  . calcium carbonate (TUMS - dosed in mg elemental calcium) chewable tablet 800 mg of elemental calcium  800 mg of elemental calcium Oral TID Arita Missyan B Asani Mcburney, MD   800 mg of elemental calcium at 03/03/14 1118  . dialysis solution 1.5% low-MG (DELFLEX) CAPD   Intraperitoneal Continuous Arita Missyan B Lequisha Cammack, MD      . feeding supplement (NEPRO CARB STEADY) liquid 237 mL  237 mL Oral BID WC Reanne Vista LawmanJ Barnett, RD      . fenofibrate tablet 160 mg  160 mg Oral Daily Scheryl MartenStephen K  Rhona Leavenshiu, MD   160 mg at 03/03/14 1117  . heparin injection 2,500 Units  2,500 Units Intraperitoneal Continuous Dyke MaesMichael T Mattingly, MD   2,500 Units at 03/02/14 2303  . HYDROmorphone (DILAUDID) injection 1 mg  1 mg Intravenous Q4H PRN Arita Missyan B Daniele Dillow, MD   1 mg at 03/02/14 2304  . metoprolol succinate (TOPROL-XL) 24 hr tablet 50 mg  50 mg Oral Daily Jerald KiefStephen K Chiu, MD   50 mg at 03/03/14 1117  . ondansetron (ZOFRAN) injection 4 mg  4 mg Intravenous Once Loma BostonStirling Harper, MD      . ondansetron [facility](ZOFRAN) tablet 4 mg  4 mg Oral Q6H PRN Jerald KiefStephen K Chiu, MD       Or  . ondansetron [facility](ZOFRAN) injection 4 mg  4 mg Intravenous Q6H PRN Jerald KiefStephen K Chiu, MD   4 mg at 03/02/14 0503  . polyethylene glycol (MIRALAX / GLYCOLAX) packet 17 g  17 g Oral Daily Arita Missyan B Tiyonna Sardinha, MD

## 2014-03-04 LAB — PATHOLOGIST SMEAR REVIEW: Path Review: REACTIVE

## 2014-03-04 LAB — RENAL FUNCTION PANEL
Albumin: 2.9 g/dL — ABNORMAL LOW (ref 3.5–5.2)
BUN: 206 mg/dL — AB (ref 6–23)
CHLORIDE: 94 meq/L — AB (ref 96–112)
CO2: 20 mEq/L (ref 19–32)
CREATININE: 23.88 mg/dL — AB (ref 0.50–1.35)
Calcium: 7.2 mg/dL — ABNORMAL LOW (ref 8.4–10.5)
GFR calc Af Amer: 2 mL/min — ABNORMAL LOW (ref >90)
GFR calc non Af Amer: 2 mL/min — ABNORMAL LOW (ref >90)
Glucose, Bld: 155 mg/dL — ABNORMAL HIGH (ref 70–99)
PHOSPHORUS: 8.9 mg/dL — AB (ref 2.3–4.6)
Potassium: 3.6 mEq/L — ABNORMAL LOW (ref 3.7–5.3)
Sodium: 140 mEq/L (ref 137–147)

## 2014-03-04 LAB — CBC WITH DIFFERENTIAL/PLATELET
BASOS PCT: 2 % — AB (ref 0–1)
Basophils Absolute: 0 10*3/uL (ref 0.0–0.1)
EOS ABS: 0.1 10*3/uL (ref 0.0–0.7)
EOS PCT: 4 % (ref 0–5)
HCT: 28.1 % — ABNORMAL LOW (ref 39.0–52.0)
HEMOGLOBIN: 9.2 g/dL — AB (ref 13.0–17.0)
Lymphocytes Relative: 31 % (ref 12–46)
Lymphs Abs: 0.5 10*3/uL — ABNORMAL LOW (ref 0.7–4.0)
MCH: 25.8 pg — ABNORMAL LOW (ref 26.0–34.0)
MCHC: 32.7 g/dL (ref 30.0–36.0)
MCV: 78.9 fL (ref 78.0–100.0)
MONO ABS: 0.2 10*3/uL (ref 0.1–1.0)
Monocytes Relative: 9 % (ref 3–12)
NEUTROS ABS: 0.9 10*3/uL — AB (ref 1.7–7.7)
NEUTROS PCT: 54 % (ref 43–77)
Platelets: 122 10*3/uL — ABNORMAL LOW (ref 150–400)
RBC: 3.56 MIL/uL — AB (ref 4.22–5.81)
RDW: 15.6 % — ABNORMAL HIGH (ref 11.5–15.5)
WBC: 1.7 10*3/uL — ABNORMAL LOW (ref 4.0–10.5)

## 2014-03-04 LAB — HIV ANTIBODY (ROUTINE TESTING W REFLEX): HIV: NONREACTIVE

## 2014-03-04 LAB — PARATHYROID HORMONE, INTACT (NO CA): PTH: 461.7 pg/mL — ABNORMAL HIGH (ref 14.0–72.0)

## 2014-03-04 MED ORDER — METOPROLOL SUCCINATE ER 50 MG PO TB24
50.0000 mg | ORAL_TABLET | Freq: Every day | ORAL | Status: DC
  Administered 2014-03-05: 50 mg via ORAL
  Filled 2014-03-04 (×2): qty 1

## 2014-03-04 MED ORDER — AMLODIPINE BESYLATE 5 MG PO TABS
5.0000 mg | ORAL_TABLET | Freq: Every day | ORAL | Status: DC
  Administered 2014-03-05: 5 mg via ORAL
  Filled 2014-03-04 (×3): qty 1

## 2014-03-04 MED ORDER — AMLODIPINE BESYLATE 5 MG PO TABS: 5.0000 mg | ORAL_TABLET | Freq: Every day | ORAL | Status: DC

## 2014-03-04 MED ORDER — PROMETHAZINE HCL 25 MG/ML IJ SOLN
12.5000 mg | Freq: Four times a day (QID) | INTRAMUSCULAR | Status: DC | PRN
  Filled 2014-03-04: qty 1

## 2014-03-04 NOTE — Progress Notes (Signed)
Patient ID: Lawrence Mckenzie, male   DOB: 1976/04/20, 38 y.o.   MRN: [id number]   [facility] Progress Note    Subjective:   Reports some N/V today, still with anorexia   Objective:   BP 143/97  Pulse 69  Temp(Src) 98.9 F (37.2 C) (Oral)  Resp 16  Ht 5\' 11"  (1.803 m)  Wt 84.641 kg (186 lb 9.6 oz)  BMI 26.04 kg/m2  SpO2 96%  Intake/Output: I/O last 3 completed shifts: In: 480 [P.O.:480] Out: -    Intake/Output this shift:    Weight change: -3.402 kg (-7 lb 8 oz)  Physical Exam: Gen:WD WN AAM in NAD CVS:no rub Resp:cta WUJ:WJXBJYAbd:benign Ext:no edema  Labs: BMET  Recent Labs Lab 03/01/14 1242 03/01/14 1710 03/02/14 0520 03/03/14 0750 03/04/14 0311  NA 138  --  140 141 140  K 3.9  --  3.9 3.9 3.6*  CL 90*  --  93* 93* 94*  CO2 16*  --  19 20 20   GLUCOSE 107*  --  110* 135* 155*  BUN 242*  --  235* 222* 206*  CREATININE 24.49* 26.42* 24.64* 24.12* 23.88*  ALBUMIN 3.4*  --  3.1*  --  2.9*  CALCIUM 6.0*  --  6.1* 6.5* 7.2*  PHOS  --   --  11.2*  --  8.9*   CBC  Recent Labs Lab 03/01/14 1242 03/01/14 1710 03/02/14 0520 03/03/14 0750 03/04/14 0311  WBC 1.9* 1.6* 1.8* 1.9* 1.7*  NEUTROABS 1.0*  --   --  1.1* 0.9*  HGB 8.9* 8.4* 9.1* 9.1* 9.2*  HCT 26.2* 25.6* 27.3* 27.4* 28.1*  MCV 78.2 79.8 78.9 78.5 78.9  PLT 99* 73* 87* 135* 122*    @IMGRELPRIORS @ Medications:    . amLODipine  10 mg Oral Daily  . calcitRIOL  0.5 mcg Oral Daily  . calcium acetate  1,334 mg Oral TID WC  . calcium carbonate  800 mg of elemental calcium Oral TID  . darbepoetin (ARANESP) injection - NON-DIALYSIS  100 mcg Subcutaneous Q Sun-1800  . feeding supplement (NEPRO CARB STEADY)  237 mL Oral BID WC  . fenofibrate  160 mg Oral Daily  . metoprolol succinate  50 mg Oral Daily  . ondansetron (ZOFRAN) IV  4 mg Intravenous Once  . polyethylene glycol  17 g Oral Daily    Assessment/ Plan:   1. Pancytopenia- w/u per primary svc, will need viral studies 2. ESRD- started  on supine PD with increasing volumes and improvement of BUN 3. 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 Nutrition:+ protein malnutrition in setting of uremia and poor po intake, follow with supplementation 6. Hypertension:stable 7. Vascular access- PD cath functioning 8. Metabolic acidosis- improving with PD 9. Dispo- will need to be set up with outpt PD later this week.  Still with profound uremia but improving numbers 10.   Lawrence Mckenzie 03/04/2014, 11:12 AM

## 2014-03-04 NOTE — Progress Notes (Signed)
[facility] PROGRESS NOTE  Georgeanna LeaMichael Schranz ZOX:[id number] DOB: 1976-02-19 DOA: 03/01/2014 PCP: Cecille AverGOLDSBOROUGH,KELLIE A, MD  Assessment/Plan: Chronic kidney disease (CKD), stage IV (severe)/ESRD with uremia  -Peritoneal dialysis initiated per renal on admission -continue PD per renal Active Problems:  Pancytopenia -peritoneal fluid culture with no growth to date -Followup B12 and  iron levels elevated -He is hemodynamically stable, follow Hypocalcemia -Continue supplemental calcium Moderate malnutrition-in the setting of chronic illness -Appreciate dietary input, continue nutritional supplements Code Status: Full Family Communication: None at bedside Disposition Plan: to home when medically ready   Consultants:   renal  Procedures:   none  Antibiotics:   none  HPI/Subjective: N/V today, denies abd pain  Objective: Filed Vitals:   03/04/14 1719  BP: 127/88  Pulse: 63  Temp: 98.6 F (37 C)  Resp: 17   No intake or output data in the 24 hours ending 03/04/14 1849 Filed Weights   03/02/14 1021 03/02/14 1700 03/03/14 1300  Weight: 88.043 kg (194 lb 1.6 oz) 86.5 kg (190 lb 11.2 oz) 84.641 kg (186 lb 9.6 oz)    Exam:  General: alert & or sleepy but easily aroused,  In NAD Cardiovascular: RRR, nl S1 s2 Respiratory: CTAB Abdomen: soft +BS NT/ND, no masses palpable Extremities: No cyanosis and no edema    Data Reviewed: Basic Metabolic Panel:  Recent Labs Lab 03/01/14 1242 03/01/14 1710 03/02/14 0520 03/03/14 0750 03/04/14 0311  NA 138  --  140 141 140  K 3.9  --  3.9 3.9 3.6*  CL 90*  --  93* 93* 94*  CO2 16*  --  19 20 20   GLUCOSE 107*  --  110* 135* 155*  BUN 242*  --  235* 222* 206*  CREATININE 24.49* 26.42* 24.64* 24.12* 23.88*  CALCIUM 6.0*  --  6.1* 6.5* 7.2*  PHOS  --   --  11.2*  --  8.9*   Liver Function Tests:  Recent Labs Lab 03/01/14 1242 03/02/14 0520 03/04/14 0311  AST 178* 174*  --   ALT 141* 137*  --   ALKPHOS 44 39   --   BILITOT 0.6 0.5  --   PROT 6.7 6.1  --   ALBUMIN 3.4* 3.1* 2.9*   No results found for this basename: LIPASE, AMYLASE,  in the last 168 hours No results found for this basename: AMMONIA,  in the last 168 hours CBC:  Recent Labs Lab 03/01/14 1242 03/01/14 1710 03/02/14 0520 03/03/14 0750 03/04/14 0311  WBC 1.9* 1.6* 1.8* 1.9* 1.7*  NEUTROABS 1.0*  --   --  1.1* 0.9*  HGB 8.9* 8.4* 9.1* 9.1* 9.2*  HCT 26.2* 25.6* 27.3* 27.4* 28.1*  MCV 78.2 79.8 78.9 78.5 78.9  PLT 99* 73* 87* 135* 122*   Cardiac Enzymes: No results found for this basename: CKTOTAL, CKMB, CKMBINDEX, TROPONINI,  in the last 168 hours BNP (last 3 results) No results found for this basename: PROBNP,  in the last 8760 hours CBG: No results found for this basename: GLUCAP,  in the last 168 hours  Recent Results (from the past 240 hour(s))  BODY FLUID CULTURE     Status: None   Collection Time    03/02/14  2:33 AM      Result Value Ref Range Status   Specimen Description FLUID PERITONEAL   Final   Special Requests NONE   Final   Gram Stain     Final   Value: WBC PRESENT, PREDOMINANTLY PMN     NO  ORGANISMS SEEN     Performed at [facility]   Culture     Final   Value: NO GROWTH 2 DAYS     Performed at [facility]   Report Status PENDING   Incomplete     Studies: No results found.  Scheduled Meds: . amLODipine  10 mg Oral Daily  . calcitRIOL  0.5 mcg Oral Daily  . calcium acetate  1,334 mg Oral TID WC  . calcium carbonate  800 mg of elemental calcium Oral TID  . darbepoetin (ARANESP) injection - NON-DIALYSIS  100 mcg Subcutaneous Q Sun-1800  . feeding supplement (NEPRO CARB STEADY)  237 mL Oral BID WC  . fenofibrate  160 mg Oral Daily  . metoprolol succinate  50 mg Oral Daily  . ondansetron (ZOFRAN) IV  4 mg Intravenous Once  . polyethylene glycol  17 g Oral Daily   Continuous Infusions: . dialysis solution 1.5% low-MG Stopped (03/04/14 1008)  . heparin      Principal  Problem:   ARF (acute renal failure) Active Problems:   Chronic kidney disease (CKD), stage IV (severe)   AKI (acute kidney injury)   Malnutrition of moderate degree    Time spent:25    Kela MillinAdeline C Vicky Schleich  [facility] Pager [phone]. If 7PM-7AM, please contact night-coverage at [email or web address], password [email or web address] 03/04/2014, 6:49 PM  LOS: 3 days

## 2014-03-05 LAB — RENAL FUNCTION PANEL
ALBUMIN: 3.2 g/dL — AB (ref 3.5–5.2)
BUN: 191 mg/dL — ABNORMAL HIGH (ref 6–23)
CO2: 20 mEq/L (ref 19–32)
Calcium: 7.7 mg/dL — ABNORMAL LOW (ref 8.4–10.5)
Chloride: 96 mEq/L (ref 96–112)
Creatinine, Ser: 23.33 mg/dL — ABNORMAL HIGH (ref 0.50–1.35)
GFR calc Af Amer: 2 mL/min — ABNORMAL LOW (ref >90)
GFR calc non Af Amer: 2 mL/min — ABNORMAL LOW (ref >90)
GLUCOSE: 107 mg/dL — AB (ref 70–99)
PHOSPHORUS: 8.9 mg/dL — AB (ref 2.3–4.6)
POTASSIUM: 4.1 meq/L (ref 3.7–5.3)
SODIUM: 143 meq/L (ref 137–147)

## 2014-03-05 LAB — CBC WITH DIFFERENTIAL/PLATELET
BASOS ABS: 0.1 10*3/uL (ref 0.0–0.1)
Basophils Relative: 4 % — ABNORMAL HIGH (ref 0–1)
EOS PCT: 6 % — AB (ref 0–5)
Eosinophils Absolute: 0.1 10*3/uL (ref 0.0–0.7)
HEMATOCRIT: 31.8 % — AB (ref 39.0–52.0)
HEMOGLOBIN: 10.4 g/dL — AB (ref 13.0–17.0)
LYMPHS PCT: 34 % (ref 12–46)
Lymphs Abs: 0.8 10*3/uL (ref 0.7–4.0)
MCH: 26.4 pg (ref 26.0–34.0)
MCHC: 32.7 g/dL (ref 30.0–36.0)
MCV: 80.7 fL (ref 78.0–100.0)
Monocytes Absolute: 0.1 10*3/uL (ref 0.1–1.0)
Monocytes Relative: 6 % (ref 3–12)
NEUTROS ABS: 1.2 10*3/uL — AB (ref 1.7–7.7)
Neutrophils Relative %: 50 % (ref 43–77)
Platelets: 150 10*3/uL (ref 150–400)
RBC: 3.94 MIL/uL — AB (ref 4.22–5.81)
RDW: 15.9 % — AB (ref 11.5–15.5)
WBC: 2.3 10*3/uL — AB (ref 4.0–10.5)

## 2014-03-05 MED ORDER — DELFLEX-LC/1.5% DEXTROSE 346 MOSM/L IP SOLN
Freq: Every day | INTRAPERITONEAL | Status: DC
  Administered 2014-03-05 (×2): via INTRAPERITONEAL

## 2014-03-05 MED ORDER — CALCIUM ACETATE 667 MG PO CAPS
2001.0000 mg | ORAL_CAPSULE | Freq: Three times a day (TID) | ORAL | Status: DC
  Administered 2014-03-05 – 2014-03-06 (×2): 2001 mg via ORAL
  Filled 2014-03-05 (×6): qty 3

## 2014-03-05 MED ORDER — DELFLEX-LM/1.5% DEXTROSE 346 MOSM/L IP SOLN
Freq: Once | INTRAPERITONEAL | Status: AC
  Administered 2014-03-05: 01:00:00 via INTRAPERITONEAL

## 2014-03-05 NOTE — Progress Notes (Signed)
Patient ID: Lawrence Mckenzie, male   DOB: 04/07/1976, 38 y.o.   MRN: [id number]   [facility] Progress Note    Subjective:   Feels better, no N/V   Objective:   BP 119/89  Pulse 67  Temp(Src) 98.9 F (37.2 C) (Oral)  Resp 17  Ht 5\' 11"  (1.803 m)  Wt 84.641 kg (186 lb 9.6 oz)  BMI 26.04 kg/m2  SpO2 99%  Intake/Output:     Intake/Output this shift:  Total I/O In: 240 [P.O.:240] Out: -  Weight change:   Physical Exam: Gen:WD WN AAM in NAD CVS:no rub Resp:cta QMV:HQIONGAbd:benign, PD cath in LUQ Ext:tr edema  Labs: BMET  Recent Labs Lab 03/01/14 1242 03/01/14 1710 03/02/14 0520 03/03/14 0750 03/04/14 0311 03/05/14 0420  NA 138  --  140 141 140 143  K 3.9  --  3.9 3.9 3.6* 4.1  CL 90*  --  93* 93* 94* 96  CO2 16*  --  19 20 20 20   GLUCOSE 107*  --  110* 135* 155* 107*  BUN 242*  --  235* 222* 206* 191*  CREATININE 24.49* 26.42* 24.64* 24.12* 23.88* 23.33*  ALBUMIN 3.4*  --  3.1*  --  2.9* 3.2*  CALCIUM 6.0*  --  6.1* 6.5* 7.2* 7.7*  PHOS  --   --  11.2*  --  8.9* 8.9*   CBC  Recent Labs Lab 03/01/14 1242  03/02/14 0520 03/03/14 0750 03/04/14 0311 03/05/14 0420  WBC 1.9*  < > 1.8* 1.9* 1.7* 2.3*  NEUTROABS 1.0*  --   --  1.1* 0.9* 1.2*  HGB 8.9*  < > 9.1* 9.1* 9.2* 10.4*  HCT 26.2*  < > 27.3* 27.4* 28.1* 31.8*  MCV 78.2  < > 78.9 78.5 78.9 80.7  PLT 99*  < > 87* 135* 122* 150  < > = values in this interval not displayed.  @IMGRELPRIORS @ Medications:    . amLODipine  5 mg Oral QHS  . calcitRIOL  0.5 mcg Oral Daily  . calcium acetate  1,334 mg Oral TID WC  . calcium carbonate  800 mg of elemental calcium Oral TID  . darbepoetin (ARANESP) injection - NON-DIALYSIS  100 mcg Subcutaneous Q Sun-1800  . feeding supplement (NEPRO CARB STEADY)  237 mL Oral BID WC  . fenofibrate  160 mg Oral Daily  . metoprolol succinate  50 mg Oral QHS  . ondansetron (ZOFRAN) IV  4 mg Intravenous Once  . polyethylene glycol  17 g Oral Daily      Assessment/  Plan:   1. Pancytopenia- w/u per primary svc, possibly related to uremia.  Improving, viral studies pending 2. ESRD- started on supine PD with increasing volumes and improvement of BUN/Cr 1. Will start outpt PD training once discharged from hospital 3. 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 Nutrition:+ protein malnutrition in setting of uremia and poor po intake, follow with supplementation 6. Hypertension:stable 7. Vascular access- PD cath functioning 8. Metabolic acidosis- improving with PD 9. Dispo- will need to be set up with outpt PD later this week. Would like to d/c to home first thing tomorrow morning after PD and go to hometraining for Wed-Fri.   10Julien Nordmann.  Farrin Shadle A Rajinder Mesick 03/05/2014, 10:19 AM

## 2014-03-05 NOTE — Progress Notes (Signed)
[facility] PROGRESS NOTE  Georgeanna LeaMichael Lapier YNW:[id number] DOB: 1976-01-19 DOA: 03/01/2014 PCP: Cecille AverGOLDSBOROUGH,KELLIE A, MD  Assessment/Plan: Chronic kidney disease (CKD), stage IV (severe)/ESRD with uremia  -Peritoneal dialysis initiated per renal on admission -continue PD per renal Active Problems:  Pancytopenia -peritoneal fluid culture with no growth to date -Followup B12 and  iron levels elevated -He is hemodynamically stable, follow Hypocalcemia -Continue supplemental calcium Moderate malnutrition-in the setting of chronic illness -Appreciate dietary input, continue nutritional supplements Code Status: Full Family Communication: None at bedside Disposition Plan: per Renal Possible DC in a.m   Consultants:   renal  Procedures:   none  Antibiotics:   none  HPI/Subjective: States no further nausea or vomiting today.  Objective: Filed Vitals:   03/05/14 1300  BP: 140/102  Pulse: 81  Temp: 98.5 F (36.9 C)  Resp: 18    Intake/Output Summary (Last 24 hours) at 03/05/14 1733 Last data filed at 03/05/14 1300  Gross per 24 hour  Intake    240 ml  Output      0 ml  Net    240 ml   Filed Weights   03/02/14 1021 03/02/14 1700 03/03/14 1300  Weight: 88.043 kg (194 lb 1.6 oz) 86.5 kg (190 lb 11.2 oz) 84.641 kg (186 lb 9.6 oz)    Exam:  General: alert & oriented x3,  In NAD Cardiovascular: RRR, nl S1 s2 Respiratory: CTAB Abdomen: soft +BS NT/ND, no masses palpable Extremities: No cyanosis and no edema    Data Reviewed: Basic Metabolic Panel:  Recent Labs Lab 03/01/14 1242 03/01/14 1710 03/02/14 0520 03/03/14 0750 03/04/14 0311 03/05/14 0420  NA 138  --  140 141 140 143  K 3.9  --  3.9 3.9 3.6* 4.1  CL 90*  --  93* 93* 94* 96  CO2 16*  --  19 20 20 20   GLUCOSE 107*  --  110* 135* 155* 107*  BUN 242*  --  235* 222* 206* 191*  CREATININE 24.49* 26.42* 24.64* 24.12* 23.88* 23.33*  CALCIUM 6.0*  --  6.1* 6.5* 7.2* 7.7*  PHOS  --   --  11.2*   --  8.9* 8.9*   Liver Function Tests:  Recent Labs Lab 03/01/14 1242 03/02/14 0520 03/04/14 0311 03/05/14 0420  AST 178* 174*  --   --   ALT 141* 137*  --   --   ALKPHOS 44 39  --   --   BILITOT 0.6 0.5  --   --   PROT 6.7 6.1  --   --   ALBUMIN 3.4* 3.1* 2.9* 3.2*   No results found for this basename: LIPASE, AMYLASE,  in the last 168 hours No results found for this basename: AMMONIA,  in the last 168 hours CBC:  Recent Labs Lab 03/01/14 1242 03/01/14 1710 03/02/14 0520 03/03/14 0750 03/04/14 0311 03/05/14 0420  WBC 1.9* 1.6* 1.8* 1.9* 1.7* 2.3*  NEUTROABS 1.0*  --   --  1.1* 0.9* 1.2*  HGB 8.9* 8.4* 9.1* 9.1* 9.2* 10.4*  HCT 26.2* 25.6* 27.3* 27.4* 28.1* 31.8*  MCV 78.2 79.8 78.9 78.5 78.9 80.7  PLT 99* 73* 87* 135* 122* 150   Cardiac Enzymes: No results found for this basename: CKTOTAL, CKMB, CKMBINDEX, TROPONINI,  in the last 168 hours BNP (last 3 results) No results found for this basename: PROBNP,  in the last 8760 hours CBG: No results found for this basename: GLUCAP,  in the last 168 hours  Recent Results (from the past 240  hour(s))  BODY FLUID CULTURE     Status: None   Collection Time    03/02/14  2:33 AM      Result Value Ref Range Status   Specimen Description FLUID PERITONEAL   Final   Special Requests NONE   Final   Gram Stain     Final   Value: WBC PRESENT, PREDOMINANTLY PMN     NO ORGANISMS SEEN     Performed at [facility]   Culture     Final   Value: NO GROWTH 3 DAYS     Performed at [facility]   Report Status PENDING   Incomplete     Studies: No results found.  Scheduled Meds: . amLODipine  5 mg Oral QHS  . calcitRIOL  0.5 mcg Oral Daily  . calcium acetate  2,001 mg Oral TID WC  . calcium carbonate  800 mg of elemental calcium Oral TID  . darbepoetin (ARANESP) injection - NON-DIALYSIS  100 mcg Subcutaneous Q Sun-1800  . feeding supplement (NEPRO CARB STEADY)  237 mL Oral BID WC  . fenofibrate  160 mg Oral  Daily  . metoprolol succinate  50 mg Oral QHS  . ondansetron (ZOFRAN) IV  4 mg Intravenous Once  . polyethylene glycol  17 g Oral Daily   Continuous Infusions: . dialysis solution 1.5% low-MG Stopped (03/04/14 1008)  . heparin      Principal Problem:   ARF (acute renal failure) Active Problems:   Chronic kidney disease (CKD), stage IV (severe)   AKI (acute kidney injury)   Malnutrition of moderate degree    Time spent:25    Kela MillinAdeline C Gaither Biehn  [facility] Pager [phone]. If 7PM-7AM, please contact night-coverage at [email or web address], password [email or web address] 03/05/2014, 5:33 PM  LOS: 4 days

## 2014-03-05 NOTE — Progress Notes (Signed)
Patient needs letter faxed to his job @ [employer] stating his admission date and possible d/c date. Information is to be sent to Lawernce Keasony Smith. Phone number is: [phone]. His fax number is: [phone].

## 2014-03-06 LAB — CBC WITH DIFFERENTIAL/PLATELET
BASOS ABS: 0 10*3/uL (ref 0.0–0.1)
Basophils Relative: 2 % — ABNORMAL HIGH (ref 0–1)
Eosinophils Absolute: 0.1 10*3/uL (ref 0.0–0.7)
Eosinophils Relative: 5 % (ref 0–5)
HEMATOCRIT: 28.3 % — AB (ref 39.0–52.0)
Hemoglobin: 9.3 g/dL — ABNORMAL LOW (ref 13.0–17.0)
Lymphocytes Relative: 35 % (ref 12–46)
Lymphs Abs: 0.8 10*3/uL (ref 0.7–4.0)
MCH: 26.3 pg (ref 26.0–34.0)
MCHC: 32.9 g/dL (ref 30.0–36.0)
MCV: 80.2 fL (ref 78.0–100.0)
MONOS PCT: 10 % (ref 3–12)
Monocytes Absolute: 0.2 10*3/uL (ref 0.1–1.0)
Neutro Abs: 1.1 10*3/uL — ABNORMAL LOW (ref 1.7–7.7)
Neutrophils Relative %: 48 % (ref 43–77)
PLATELETS: 131 10*3/uL — AB (ref 150–400)
RBC: 3.53 MIL/uL — ABNORMAL LOW (ref 4.22–5.81)
RDW: 16.1 % — AB (ref 11.5–15.5)
WBC: 2.2 10*3/uL — AB (ref 4.0–10.5)

## 2014-03-06 LAB — BODY FLUID CULTURE: CULTURE: NO GROWTH

## 2014-03-06 LAB — RENAL FUNCTION PANEL
ALBUMIN: 2.9 g/dL — AB (ref 3.5–5.2)
BUN: 175 mg/dL — ABNORMAL HIGH (ref 6–23)
CO2: 22 mEq/L (ref 19–32)
Calcium: 8 mg/dL — ABNORMAL LOW (ref 8.4–10.5)
Chloride: 95 mEq/L — ABNORMAL LOW (ref 96–112)
Creatinine, Ser: 22.96 mg/dL — ABNORMAL HIGH (ref 0.50–1.35)
GFR calc non Af Amer: 2 mL/min — ABNORMAL LOW (ref >90)
GFR, EST AFRICAN AMERICAN: 2 mL/min — AB (ref >90)
Glucose, Bld: 132 mg/dL — ABNORMAL HIGH (ref 70–99)
PHOSPHORUS: 8.8 mg/dL — AB (ref 2.3–4.6)
POTASSIUM: 3.7 meq/L (ref 3.7–5.3)
SODIUM: 142 meq/L (ref 137–147)

## 2014-03-06 MED ORDER — POLYETHYLENE GLYCOL 3350 17 G PO PACK: 17.0000 g | PACK | Freq: Every day | ORAL | Status: DC

## 2014-03-06 MED ORDER — ONDANSETRON HCL 4 MG PO TABS: 4.0000 mg | ORAL_TABLET | Freq: Three times a day (TID) | ORAL | Status: DC | PRN

## 2014-03-06 NOTE — Progress Notes (Signed)
Patient ID: Lawrence Mckenzie, male   DOB: October 02, 1976, 38 y.o.   MRN: [id number]  [facility] Progress Note    Subjective:   Feels better and anxious to go home   Objective:   BP 137/89  Pulse 67  Temp(Src) 98.4 F (36.9 C) (Oral)  Resp 18  Ht 5\' 11"  (1.803 m)  Wt 84.641 kg (186 lb 9.6 oz)  BMI 26.04 kg/m2  SpO2 98%  Intake/Output: I/O last 3 completed shifts: In: 240 [P.O.:240] Out: -    Intake/Output this shift:    Weight change:   Physical Exam: Gen:WD WN male in NAD CVS:rrr no rub Resp:cta NWG:NFAOZHAbd:benign Ext:tr edema  Labs: BMET  Recent Labs Lab 03/01/14 1242 03/01/14 1710 03/02/14 0520 03/03/14 0750 03/04/14 0311 03/05/14 0420 03/06/14 0616  NA 138  --  140 141 140 143 142  K 3.9  --  3.9 3.9 3.6* 4.1 3.7  CL 90*  --  93* 93* 94* 96 95*  CO2 16*  --  19 20 20 20 22   GLUCOSE 107*  --  110* 135* 155* 107* 132*  BUN 242*  --  235* 222* 206* 191* 175*  CREATININE 24.49* 26.42* 24.64* 24.12* 23.88* 23.33* 22.96*  ALBUMIN 3.4*  --  3.1*  --  2.9* 3.2* 2.9*  CALCIUM 6.0*  --  6.1* 6.5* 7.2* 7.7* 8.0*  PHOS  --   --  11.2*  --  8.9* 8.9* 8.8*   CBC  Recent Labs Lab 03/03/14 0750 03/04/14 0311 03/05/14 0420 03/06/14 0616  WBC 1.9* 1.7* 2.3* 2.2*  NEUTROABS 1.1* 0.9* 1.2* 1.1*  HGB 9.1* 9.2* 10.4* 9.3*  HCT 27.4* 28.1* 31.8* 28.3*  MCV 78.5 78.9 80.7 80.2  PLT 135* 122* 150 131*    @IMGRELPRIORS @ Medications:    . amLODipine  5 mg Oral QHS  . calcitRIOL  0.5 mcg Oral Daily  . calcium acetate  2,001 mg Oral TID WC  . calcium carbonate  800 mg of elemental calcium Oral TID  . darbepoetin (ARANESP) injection - NON-DIALYSIS  100 mcg Subcutaneous Q Sun-1800  . dialysis solution for CAPD/CCPD [facility](DELFLEX)   Peritoneal Dialysis 6 X Daily  . feeding supplement (NEPRO CARB STEADY)  237 mL Oral BID WC  . fenofibrate  160 mg Oral Daily  . metoprolol succinate  50 mg Oral QHS  . ondansetron (ZOFRAN) IV  4 mg Intravenous Once  . polyethylene  glycol  17 g Oral Daily     Assessment/ Plan:   1. Pancytopenia- w/u per primary svc, possibly related to uremia. Improving, viral studies pending 2. ESRD- doing well on supine PD 3. 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 Nutrition:+ protein malnutrition in setting of uremia and poor po intake, follow with supplementation 6. Hypertension:stable 7. Vascular access- PD cath functioning 8. Metabolic acidosis- improving with PD 9. Dispo- ok for d/c today and f/u at hometraining today and cont with outpt PD Monday thru Friday.    10Julien Nordmann.   Perri Aragones A Sharna Gabrys 03/06/2014, 12:30 PM

## 2014-03-06 NOTE — Discharge Instructions (Signed)
Follow with Primary MD Annie SableGOLDSBOROUGH,KELLIE A, MD in 2 days   Get CBC, CMP, checked 2 days by Primary MD and again as instructed by your Primary MD.   Go to the dialysis center as instructed straight from the hospital for paratonial dialysis teaching.   Activity: As tolerated with Full fall precautions use walker/cane & assistance as needed   Disposition Home    Diet: Heart Healthy Renal.  For Heart failure patients - Check your Weight same time everyday, if you gain over 2 pounds, or you develop in leg swelling, experience more shortness of breath or chest pain, call your Primary MD immediately. Follow Cardiac Low Salt Diet and 1.8 lit/day fluid restriction.   On your next visit with her primary care physician please Get Medicines reviewed and adjusted.  Please request your Prim.MD to go over all Hospital Tests and Procedure/Radiological results at the follow up, please get all Hospital records sent to your Prim MD by signing hospital release before you go home.   If you experience worsening of your admission symptoms, develop shortness of breath, life threatening emergency, suicidal or homicidal thoughts you must seek medical attention immediately by calling 911 or calling your MD immediately  if symptoms less severe.  You Must read complete instructions/literature along with all the possible adverse reactions/side effects for all the Medicines you take and that have been prescribed to you. Take any new Medicines after you have completely understood and accpet all the possible adverse reactions/side effects.   Do not drive and provide baby sitting services if your were admitted for syncope or siezures until you have seen by Primary MD or a Neurologist and advised to do so again.  Do not drive when taking Pain medications.    Do not take more than prescribed Pain, Sleep and Anxiety Medications  Special Instructions: If you have smoked or chewed Tobacco  in the last 2 yrs please stop  smoking, stop any regular Alcohol  and or any Recreational drug use.  Wear Seat belts while driving.   Please note  You were cared for by a hospitalist during your hospital stay. If you have any questions about your discharge medications or the care you received while you were in the hospital after you are discharged, you can call the unit and asked to speak with the hospitalist on call if the hospitalist that took care of you is not available. Once you are discharged, your primary care physician will handle any further medical issues. Please note that NO REFILLS for any discharge medications will be authorized once you are discharged, as it is imperative that you return to your primary care physician (or establish a relationship with a primary care physician if you do not have one) for your aftercare needs so that they can reassess your need for medications and monitor your lab values.  Lawrence Mckenzie was admitted to the Hospital on 03/01/2014 and Discharged  03/06/2014 and should be excused from work/school   for 10 days starting 03/01/2014 , may return to work/school without any restrictions.  Call Lawrence RaringPrashant [name] MD, [facility] [phone] with questions.  Lawrence Mckenzie M.D on 03/06/2014,at 10:13 AM  [facility] Office  [phone]

## 2014-03-06 NOTE — Discharge Summary (Signed)
Lawrence LeaMichael Mckenzie, is a 38 y.o. male  DOB Feb 05, 1976  MRN [id number].  Admission date:  03/01/2014  Admitting Physician  Jerald KiefStephen K Chiu, MD  Discharge Date:  03/06/2014   Primary MD  Cecille AverGOLDSBOROUGH,KELLIE A, MD  Recommendations for primary care physician for things to follow:   Follow CBC BMP closely please make sure patient is compliant with home peritoneal dialysis   Admission Diagnosis  HTN (hypertension) [401.9] ARF (acute renal failure) [584.9] Chronic kidney disease (CKD), stage IV (severe) [585.4] AKI (acute kidney injury) [584.9]   Discharge Diagnosis  HTN (hypertension) [401.9] ARF (acute renal failure) [584.9] Chronic kidney disease (CKD), stage IV (severe) [585.4] AKI (acute kidney injury) [584.9]    Principal Problem:   ARF (acute renal failure) Active Problems:   Chronic kidney disease (CKD), stage IV (severe)   AKI (acute kidney injury)   Malnutrition of moderate degree      Past Medical History  Diagnosis Date  . Chronic kidney disease   . Hypertension   . Hyperlipidemia   . Anemia   . PONV (postoperative nausea and vomiting)     Past Surgical History  Procedure Laterality Date  . Wisdom tooth extraction      Hx; of  . Capd insertion N/A 01/30/2014    Procedure: LAPAROSCOPIC INSERTION CONTINUOUS AMBULATORY PERITONEAL DIALYSIS CATHETER;  Surgeon: Axel FillerArmando Ramirez, MD;  Location: [facility] OR;  Service: General;  Laterality: N/A;  . Laparoscopy N/A 02/07/2014    Procedure: LAPAROSCOPY DIAGNOSTIC; REINSERTION OF CAPD ;  Surgeon: Axel FillerArmando Ramirez, MD;  Location: [facility] OR;  Service: General;  Laterality: N/A;     Discharge Condition: Stable   Follow UP  Follow-up Information   Follow up with GOLDSBOROUGH,KELLIE A, MD. Schedule an appointment as soon as possible for a visit in 2 days.   Specialty:  Nephrology   Contact information:   [address] [place] [id number] [phone]         Discharge Instructions  and  Discharge Medications          Discharge Orders   Future Appointments Provider Department Dept Phone   03/29/2014 4:50 PM Axel FillerArmando Ramirez, MD [facility] [place] [phone]   Future Orders Complete By Expires   Discharge instructions  As directed    Scheduling Instructions:   Follow with Primary MD Annie SableGOLDSBOROUGH,KELLIE A, MD in 2 days   Get CBC, CMP, checked 2 days by Primary MD and again as instructed by your Primary MD.   Go to the dialysis center as instructed straight from the hospital for paratonial dialysis teaching.   Activity: As tolerated with Full fall precautions use walker/cane & assistance as needed   Disposition Home    Diet: Heart Healthy Renal.  For Heart failure patients - Check your Weight same time everyday, if you gain over 2 pounds, or you develop in leg swelling, experience more shortness of breath or chest pain, call your Primary MD immediately. Follow Cardiac Low Salt Diet and 1.8 lit/day fluid restriction.   On your next visit  with her primary care physician please Get Medicines reviewed and adjusted.  Please request your Prim.MD to go over all Hospital Tests and Procedure/Radiological results at the follow up, please get all Hospital records sent to your Prim MD by signing hospital release before you go home.   If you experience worsening of your admission symptoms, develop shortness of breath, life threatening emergency, suicidal or homicidal thoughts you must seek medical attention immediately by calling 911 or calling your MD immediately  if symptoms less severe.  You Must read complete instructions/literature along with all the possible adverse reactions/side effects for all the Medicines you take and that have been prescribed to you. Take any new Medicines after you have completely understood and accpet all the possible adverse  reactions/side effects.   Do not drive and provide baby sitting services if your were admitted for syncope or siezures until you have seen by Primary MD or a Neurologist and advised to do so again.  Do not drive when taking Pain medications.    Do not take more than prescribed Pain, Sleep and Anxiety Medications  Special Instructions: If you have smoked or chewed Tobacco  in the last 2 yrs please stop smoking, stop any regular Alcohol  and or any Recreational drug use.  Wear Seat belts while driving.   Please note  You were cared for by a hospitalist during your hospital stay. If you have any questions about your discharge medications or the care you received while you were in the hospital after you are discharged, you can call the unit and asked to speak with the hospitalist on call if the hospitalist that took care of you is not available. Once you are discharged, your primary care physician will handle any further medical issues. Please note that NO REFILLS for any discharge medications will be authorized once you are discharged, as it is imperative that you return to your primary care physician (or establish a relationship with a primary care physician if you do not have one) for your aftercare needs so that they can reassess your need for medications and monitor your lab values.   Increase activity slowly  As directed        Medication List         amLODipine 10 MG tablet  Commonly known as:  NORVASC  Take 10 mg by mouth daily.     calcitRIOL 0.25 MCG capsule  Commonly known as:  ROCALTROL  Take 0.25 mcg by mouth daily.     calcium carbonate 500 MG chewable tablet  Commonly known as:  TUMS - dosed in mg elemental calcium  Chew 1 tablet by mouth daily.     epoetin alfa 40981 UNIT/ML injection  Commonly known as:  EPOGEN,PROCRIT  Inject 20,000 Units into the skin every 14 (fourteen) days.     furosemide 80 MG tablet  Commonly known as:  LASIX  Take 80 mg by mouth 2 (two)  times daily.     metoprolol succinate 50 MG 24 hr tablet  Commonly known as:  TOPROL-XL  Take 50 mg by mouth daily. Take with or immediately following a meal.     ondansetron 4 MG tablet  Commonly known as:  ZOFRAN  Take 1 tablet (4 mg total) by mouth every 8 (eight) hours as needed for nausea or vomiting.     polyethylene glycol packet  Commonly known as:  MIRALAX / GLYCOLAX  Take 17 g by mouth daily.     TRILIPIX 135 MG  capsule  Generic drug:  Choline Fenofibrate  Take 135 mg by mouth daily.          Diet and Activity recommendation: See Discharge Instructions above   Consults obtained - Renal   Major procedures and Radiology Reports - PLEASE review detailed and final reports for all details, in brief -       Dg Abd 1 View  03/02/2014   CLINICAL DATA:  Peritoneal dialysis catheter placement  EXAM: ABDOMEN - 1 VIEW  COMPARISON:  None.  FINDINGS: The catheter tip region is coiled in the pelvis. Bowel gas pattern is unremarkable. No obstruction or free air is seen on this supine examination. There is moderate stool in the colon. There are phleboliths in the pelvis.  IMPRESSION: The distal dialysis catheter is positioned in the mid-pelvis. Bowel gas pattern unremarkable.   Electronically Signed   By: Bretta Bang M.D.   On: 03/02/2014 19:18    Micro Results      Recent Results (from the past 240 hour(s))  BODY FLUID CULTURE     Status: None   Collection Time    03/02/14  2:33 AM      Result Value Ref Range Status   Specimen Description FLUID PERITONEAL   Final   Special Requests NONE   Final   Gram Stain     Final   Value: WBC PRESENT, PREDOMINANTLY PMN     NO ORGANISMS SEEN     Performed at [facility]   Culture     Final   Value: NO GROWTH 3 DAYS     Performed at [facility]   Report Status PENDING   Incomplete     History of present illness and  Hospital Course:     Kindly see H&P for history of present illness and admission  details, please review complete Labs, Consult reports and Test reports for all details in brief Lawrence Mckenzie, is a 38 y.o. male, patient with history of  chronic kidney disease stage IV to 5, hypertension, dyslipidemia who was admitted to the hospital for worsening renal function causing uremia and mild confusion along with pancytopenia, he was seen by renal and initiated on paratonia dialysis this admission.    His BUN has improved and creatinine is gradually coming down, I discussed his case with nephrologist on call Dr. Rich Reining who is agreeable that patient should be discharged home with outpatient peritoneal dialysis for which she will be educated after discharge at outpatient dialysis center which patient has been informed to by renal service in detail. Plan is to follow with his primary nephrologist Dr. Kathrene Bongo in 1-2 days for outpatient monitoring and followup of his CBC and BMP     He will commence his home medications for his chronic medical issues which are hypertension, dyslipidemia, hypocalcemia. He did have pancytopenia in her which appears to be related to his severe renal dysfunction and now what appears to be ESRD, he will have his CBC monitored in the outpatient setting his pancytopenia persists one time outpatient hematology followup can be considered by primary care physician.    Today   Subjective:   Lawrence Mckenzie today has no headache,no chest abdominal pain,no new weakness tingling or numbness, feels much better wants to go home today.    Objective:   Blood pressure 137/89, pulse 67, temperature 98.4 F (36.9 C), temperature source Oral, resp. rate 18, height 5\' 11"  (1.803 m), weight 84.641 kg (186 lb 9.6 oz), SpO2 98.00%.  Intake/Output Summary (Last 24 hours) at 03/06/14 0957 Last data filed at 03/05/14 1700  Gross per 24 hour  Intake      0 ml  Output      0 ml  Net      0 ml    Exam Awake Alert, Oriented *3, No new F.N deficits, Normal  affect [place].AT,PERRAL Supple Neck,No JVD, No cervical lymphadenopathy appriciated.  Symmetrical Chest wall movement, Good air movement bilaterally, CTAB RRR,No Gallops,Rubs or new Murmurs, No Parasternal Heave +ve B.Sounds, Abd Soft, Non tender, No organomegaly appriciated, No rebound -guarding or rigidity. No Cyanosis, Clubbing or edema, No new Rash or bruise  Data Review   CBC w Diff: Lab Results  Component Value Date   WBC 2.2* 03/06/2014   WBC 5.4 08/12/2013   HGB 9.3* 03/06/2014   HGB 11.6* 08/12/2013   HCT 28.3* 03/06/2014   HCT 38.1* 08/12/2013   PLT 131* 03/06/2014   LYMPHOPCT PENDING 03/06/2014   BANDSPCT PENDING 03/06/2014   MONOPCT PENDING 03/06/2014   EOSPCT PENDING 03/06/2014   BASOPCT PENDING 03/06/2014    CMP: Lab Results  Component Value Date   NA 142 03/06/2014   K 3.7 03/06/2014   CL 95* 03/06/2014   CO2 22 03/06/2014   BUN 175* 03/06/2014   CREATININE 22.96* 03/06/2014   CREATININE 8.13* 08/12/2013   PROT 6.1 03/02/2014   ALBUMIN 2.9* 03/06/2014   BILITOT 0.5 03/02/2014   ALKPHOS 39 03/02/2014   AST 174* 03/02/2014   ALT 137* 03/02/2014  .   Total Time in preparing paper work, data evaluation and todays exam - 35 minutes  Leroy SeaPrashant K Singh M.D on 03/06/2014 at 9:57 AM  [facility] Office  [phone]

## 2014-03-06 NOTE — Progress Notes (Signed)
Patient discharge teaching given, including activity, diet, follow-up appoints, and medications. Patient verbalized understanding of all discharge instructions. IV access was d/c'd. Vitals are stable. Skin is intact except as charted in most recent assessments. Pt to be escorted out by NT, to be driven home by family.  Kadi Hession, MBA, BS, RN 

## 2014-03-15 ENCOUNTER — Ambulatory Visit (INDEPENDENT_AMBULATORY_CARE_PROVIDER_SITE_OTHER): Payer: Managed Care, Other (non HMO)

## 2014-03-15 ENCOUNTER — Telehealth (INDEPENDENT_AMBULATORY_CARE_PROVIDER_SITE_OTHER): Payer: Self-pay | Admitting: General Surgery

## 2014-03-15 VITALS — BP 124/86 | Temp 97.9°F | Resp 18 | Wt 177.2 lb

## 2014-03-15 DIAGNOSIS — Z4802 Encounter for removal of sutures: Secondary | ICD-10-CM

## 2014-03-15 NOTE — Telephone Encounter (Signed)
Pt walked-in to clinic, asking for sutures to be removed.  Surgery to reposition CAPD catheter on 02/07/14.  Obtained verbal order from Dr. Derrell Lollingamirez to remove sutures and brought pt in as Nurse Only for removal.  He has no-showed for two previous appts.

## 2014-03-15 NOTE — Progress Notes (Signed)
Patient walked into office today requesting suture removal from his CAPD Catheter that was placed on 02/07/14.  Per Britta MccreedyBarbara, RN verbal order received by Dr. Derrell Lollingamirez to remove suture from CAPD.  Patient had a post operative appointment on 02/18/14 which was noted in the system as a No Show Appointment.  Patient reports he wasn't aware of appointment date & time.  Suture removed from Catheter.  Incision healing well, CAPD Catheter working well as reported by the patient.  Reapplied gauze and patient left.  Patient tolerated well.

## 2014-03-29 ENCOUNTER — Ambulatory Visit (INDEPENDENT_AMBULATORY_CARE_PROVIDER_SITE_OTHER): Payer: Managed Care, Other (non HMO) | Admitting: General Surgery

## 2014-03-29 ENCOUNTER — Encounter (INDEPENDENT_AMBULATORY_CARE_PROVIDER_SITE_OTHER): Payer: Self-pay | Admitting: General Surgery

## 2014-03-29 VITALS — BP 118/82 | HR 78 | Temp 97.2°F | Resp 12 | Wt 177.8 lb

## 2014-03-29 DIAGNOSIS — Z9889 Other specified postprocedural states: Secondary | ICD-10-CM

## 2014-03-29 NOTE — Progress Notes (Signed)
Patient ID: Lawrence Mckenzie, male   DOB: [birth year], 38 y.o.   MRN: [id number] Post op course Patient is a 38 year old male status post CAPD catheter placement. Patient has been using his PCP catheter at home at nighttime and has been working well. He has no complaints at this time.  On Exam: His wound is clean dry and intact, catheter is in place   Assessment and Plan 38 year old male status post CAPD catheter placement 1. Patient can continue with dialysis as directed 2. Patient followup at the   Axel FillerArmando Electra Paladino, MD [facility], [place] [facility]imally Invasive Surgery Trauma & Emergency Surgery

## 2014-05-01 ENCOUNTER — Encounter (INDEPENDENT_AMBULATORY_CARE_PROVIDER_SITE_OTHER): Payer: Self-pay

## 2014-08-08 ENCOUNTER — Other Ambulatory Visit: Payer: Self-pay | Admitting: *Deleted

## 2014-08-08 DIAGNOSIS — M79669 Pain in unspecified lower leg: Secondary | ICD-10-CM

## 2014-08-12 ENCOUNTER — Ambulatory Visit (INDEPENDENT_AMBULATORY_CARE_PROVIDER_SITE_OTHER): Payer: Managed Care, Other (non HMO) | Admitting: Family Medicine

## 2014-08-12 VITALS — BP 130/80 | HR 80 | Temp 98.3°F | Resp 14 | Ht 69.0 in | Wt 192.0 lb

## 2014-08-12 DIAGNOSIS — D631 Anemia in chronic kidney disease: Secondary | ICD-10-CM

## 2014-08-12 DIAGNOSIS — N189 Chronic kidney disease, unspecified: Secondary | ICD-10-CM

## 2014-08-12 DIAGNOSIS — Z131 Encounter for screening for diabetes mellitus: Secondary | ICD-10-CM

## 2014-08-12 DIAGNOSIS — N039 Chronic nephritic syndrome with unspecified morphologic changes: Secondary | ICD-10-CM

## 2014-08-12 DIAGNOSIS — Z0000 Encounter for general adult medical examination without abnormal findings: Secondary | ICD-10-CM

## 2014-08-12 DIAGNOSIS — Z13220 Encounter for screening for lipoid disorders: Secondary | ICD-10-CM

## 2014-08-12 NOTE — Progress Notes (Signed)
[facility] [address] [place] [address] [phone]  Date:  08/12/2014   Name:  Lawrence Mckenzie   DOB:  1976/06/26   MRN:  [id number]  PCP:  Cecille Aver, MD    Chief Complaint: Annual Exam   History of Present Illness:  Lawrence Mckenzie is a 38 y.o. very pleasant male patient who presents with the following:  He is here today for a CPE.  He has a history of CKD, managed by Dr. Kathrene Bongo.  He did eat today- 2-3 hours ago.   He is not sure of the date of his last tetanus shot but feels it was less than 10 years ago, already had his flu shot this year.    Patient Active Problem List   Diagnosis Date Noted  . Malnutrition of moderate degree 03/02/2014  . ARF (acute renal failure) 03/01/2014  . AKI (acute kidney injury) 03/01/2014  . Chronic kidney disease (CKD), stage IV (severe) 08/30/2013  . 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 Proteinuria 02/28/2013  . Hematuria 02/28/2013    Past Medical History  Diagnosis Date  . Chronic kidney disease   . Hypertension   . Hyperlipidemia   . Anemia   . PONV (postoperative nausea and vomiting)     Past Surgical History  Procedure Laterality Date  . Wisdom tooth extraction      Hx; of  . Capd insertion N/A 01/30/2014    Procedure: LAPAROSCOPIC INSERTION CONTINUOUS AMBULATORY PERITONEAL DIALYSIS CATHETER;  Surgeon: Axel Filler, MD;  Location: [facility] OR;  Service: General;  Laterality: N/A;  . Laparoscopy N/A 02/07/2014    Procedure: LAPAROSCOPY DIAGNOSTIC; REINSERTION OF CAPD ;  Surgeon: Axel Filler, MD;  Location: [facility] OR;  Service: General;  Laterality: N/A;    History  Substance Use Topics  . Smoking status: Never Smoker   . Smokeless tobacco: Never Used  . Alcohol Use: No    Family History  Problem Relation Age of Onset  . Hypertension Mother   . Hypertension Father     No Known Allergies  Medication list has been reviewed and updated.  Current Outpatient Prescriptions on  File Prior to Visit  Medication Sig Dispense Refill  . amLODipine (NORVASC) 10 MG tablet Take 10 mg by mouth daily.      . calcitRIOL (ROCALTROL) 0.25 MCG capsule Take 0.25 mcg by mouth daily.      . calcium acetate (PHOSLO) 667 MG capsule       . calcium carbonate (TUMS - DOSED IN MG ELEMENTAL CALCIUM) 500 MG chewable tablet Chew 1 tablet by mouth daily.      Marland Kitchen epoetin alfa (EPOGEN,PROCRIT) 29562 UNIT/ML injection Inject 20,000 Units into the skin every 14 (fourteen) days.      . Choline Fenofibrate (TRILIPIX) 135 MG capsule Take 135 mg by mouth daily.      . furosemide (LASIX) 80 MG tablet Take 80 mg by mouth 2 (two) times daily.      Marland Kitchen gentamicin cream (GARAMYCIN) 0.1 %       . metoprolol succinate (TOPROL-XL) 50 MG 24 hr tablet Take 50 mg by mouth daily. Take with or immediately following a meal.       No current facility-administered medications on file prior to visit.    Review of Systems:  As per HPI- otherwise negative.   Physical Examination: Filed Vitals:   08/12/14 1121  BP: 130/80  Pulse: 80  Temp: 98.3 F (36.8 C)  Resp: 14   Filed Vitals:  08/12/14 1121  Height:  (1.753 m)  Weight: 192 lb (87.091 kg)   Body mass index is 28.34 kg/(m^2). Ideal Body Weight: Weight in (lb) to have BMI = 25: 168.9  GEN: WDWN, NAD, Non-toxic, A & O x 3, looks well HEENT: Atraumatic, Normocephalic. Neck supple. No masses, No LAD.  Bilateral TM wnl, oropharynx normal.  PEERL,EOMI.   Ears and Nose: No external deformity. CV: RRR, No M/G/R. No JVD. No thrill. No extra heart sounds. PULM: CTA B, no wheezes, crackles, rhonchi. No retractions. No resp. distress. No accessory muscle use. ABD: S, NT, ND, +BS. No rebound. No HSM. EXTR: No c/c/e NEURO Normal gait.  PSYCH: Normally interactive. Conversant. Not depressed or anxious appearing.  Calm demeanor.  Gu: normal testicular exam, small penile wart is present  Assessment and Plan: Physical exam  Screening for hyperlipidemia  - Plan: Lipid panel  Screening for diabetes mellitus - Plan: Hemoglobin A1c  Anemia in chronic kidney disease  Advanced kidney disease, closely followed by nephrology and on transplant list.  Continue to follow with Dr. Kathrene Bongo as usual Other labs pending as above per "biometric screening" requirements for his job He gets his flu shot at work.   Will plan further follow- up pending labs.   Signed Abbe Amsterdam, MD

## 2014-08-12 NOTE — Patient Instructions (Signed)
I will be in touch with your labs.  Good to see you today- I hope that you are able to receive a kidney soon!

## 2014-08-13 ENCOUNTER — Encounter: Payer: Self-pay | Admitting: Family Medicine

## 2014-08-13 LAB — HEMOGLOBIN A1C
Hgb A1c MFr Bld: 5.6 % (ref <5.7)
MEAN PLASMA GLUCOSE: 114 mg/dL (ref <117)

## 2014-08-13 LAB — LIPID PANEL
CHOLESTEROL: 151 mg/dL (ref 0–200)
HDL: 55 mg/dL (ref >39)
LDL Cholesterol: 69 mg/dL (ref 0–99)
Total CHOL/HDL Ratio: 2.7 Ratio
Triglycerides: 133 mg/dL (ref <150)
VLDL: 27 mg/dL (ref 0–40)

## 2014-08-26 ENCOUNTER — Encounter: Payer: Self-pay | Admitting: Vascular Surgery

## 2014-08-27 ENCOUNTER — Ambulatory Visit (HOSPITAL_COMMUNITY)
Admission: RE | Admit: 2014-08-27 | Discharge: 2014-08-27 | Disposition: A | Discharge status: Home / Self Care | Payer: Managed Care, Other (non HMO) | Source: Ambulatory Visit | Attending: Vascular Surgery | Admitting: Vascular Surgery

## 2014-08-27 ENCOUNTER — Encounter: Payer: Self-pay | Admitting: Vascular Surgery

## 2014-08-27 ENCOUNTER — Ambulatory Visit (INDEPENDENT_AMBULATORY_CARE_PROVIDER_SITE_OTHER): Payer: Managed Care, Other (non HMO) | Admitting: Vascular Surgery

## 2014-08-27 VITALS — BP 146/99 | HR 85 | Resp 18 | Ht 70.5 in | Wt 201.4 lb

## 2014-08-27 DIAGNOSIS — M79605 Pain in left leg: Secondary | ICD-10-CM | POA: Insufficient documentation

## 2014-08-27 DIAGNOSIS — M79604 Pain in right leg: Secondary | ICD-10-CM

## 2014-08-27 DIAGNOSIS — M79606 Pain in leg, unspecified: Secondary | ICD-10-CM | POA: Diagnosis present

## 2014-08-27 DIAGNOSIS — M79669 Pain in unspecified lower leg: Secondary | ICD-10-CM

## 2014-08-27 NOTE — Progress Notes (Signed)
Referred by:  Cecille AverKellie A Goldsborough, MD [address] [place], [place] [address]  Reason for referral: Bilateral leg pain/wekaness/heveiness.  History of Present Illness  Lawrence Mckenzie is a 38 y.o. ([birth year]) male who presents with chief complaint: weak/heavey leg pain.  Onset of symptom occurred 5 month.  Pain is described as heavy feeling/cramps, toes are numb, and associated with walking.  Patient has attempted to treat this pain with rest and neurotin 100 mg BID.  The patient has no rest pain symptoms and no leg wounds/ulcers.  Atherosclerotic risk factors include: Hypertension and hyperlipidemia.  He was previously on cholesterol medication, but he has been taken off of it since his cholesterol lab work has improved.  He takes lasix daily for his hypertension.    Past Medical History  Diagnosis Date  . Chronic kidney disease   . Hypertension   . Hyperlipidemia   . Anemia   . PONV (postoperative nausea and vomiting)     Past Surgical History  Procedure Laterality Date  . Wisdom tooth extraction      Hx; of  . Capd insertion N/A 01/30/2014    Procedure: LAPAROSCOPIC INSERTION CONTINUOUS AMBULATORY PERITONEAL DIALYSIS CATHETER;  Surgeon: Axel FillerArmando Ramirez, MD;  Location: [facility] OR;  Service: General;  Laterality: N/A;  . Laparoscopy N/A 02/07/2014    Procedure: LAPAROSCOPY DIAGNOSTIC; REINSERTION OF CAPD ;  Surgeon: Axel FillerArmando Ramirez, MD;  Location: [facility] OR;  Service: General;  Laterality: N/A;    History   Social History  . Marital Status: Single    Spouse Name: N/A    Number of Children: N/A  . Years of Education: N/A   Occupational History  . Not on file.   Social History Main Topics  . Smoking status: Never Smoker   . Smokeless tobacco: Never Used  . Alcohol Use: Yes     Comment: 1 beer/week  . Drug Use: No  . Sexual Activity: Yes    Birth Control/ Protection: Condom   Other Topics Concern  . Not on file   Social History Narrative  . No narrative on file    Family  History  Problem Relation Age of Onset  . Hypertension Mother   . Diabetes Mother   . Hyperlipidemia Mother   . Hypertension Father   . Hyperlipidemia Father   . Diabetes Sister   . Hypertension Sister   . Hypertension Brother     Current Outpatient Prescriptions on File Prior to Visit  Medication Sig Dispense Refill  . amLODipine (NORVASC) 10 MG tablet Take 10 mg by mouth daily.      . calcitRIOL (ROCALTROL) 0.25 MCG capsule Take 0.25 mcg by mouth daily.      . calcium acetate (PHOSLO) 667 MG capsule       . calcium carbonate (TUMS - DOSED IN MG ELEMENTAL CALCIUM) 500 MG chewable tablet Chew 1 tablet by mouth daily.      Marland Kitchen. epoetin alfa (EPOGEN,PROCRIT) [id number] UNIT/ML injection Inject 20,000 Units into the skin every 14 (fourteen) days.      Marland Kitchen. gentamicin cream (GARAMYCIN) 0.1 %       . Choline Fenofibrate (TRILIPIX) 135 MG capsule Take 135 mg by mouth daily.      . furosemide (LASIX) 80 MG tablet Take 80 mg by mouth 2 (two) times daily.      . metoprolol succinate (TOPROL-XL) 50 MG 24 hr tablet Take 50 mg by mouth daily. Take with or immediately following a meal.  No current facility-administered medications on file prior to visit.    No Known Allergies  REVIEW OF SYSTEMS:  (Positives checked otherwise negative)  CARDIOVASCULAR:  [ ]  chest pain, [ ]  chest pressure, [ ]  palpitations, [ ]  shortness of breath when laying flat, [ ]  shortness of breath with exertion,   [ ]  pain in feet when walking, [ ]  pain in feet when laying flat, [ ]  history of blood clot in veins (DVT), [ ]  history of phlebitis, [ ]  swelling in legs, [ ]  varicose veins  PULMONARY:  [ ]  productive cough, [ ]  asthma, [ ]  wheezing  NEUROLOGIC:  [ ]  weakness in arms or legs, [ ]  numbness in arms or legs, [ ]  difficulty speaking or slurred speech, [ ]  temporary loss of vision in one eye, [ ]  dizziness  HEMATOLOGIC:  [ ]  bleeding problems, [ ]  problems with blood clotting too easily  MUSCULOSKEL:  [ ]  joint pain,  [ ]  joint swelling  GASTROINTEST:  [ ]   Vomiting blood, [ ]   Blood in stool     GENITOURINARY:  [ ]   Burning with urination, [ ]   Blood in urine  PSYCHIATRIC:  [ ]  history of major depression  INTEGUMENTARY:  [ ]  rashes, [ ]  ulcers  CONSTITUTIONAL:  [ ]  fever, [ ]  chills  For VQI Use Only    PRE-ADM LIVING: Home  AMB STATUS: Ambulatory  CAD Sx: None  PRIOR CHF: None  STRESS TEST: [x ] No, [ ]  Normal, [ ]  + ischemia, [ ]  + MI, [ ]  Both    Physical Examination Filed Vitals:   08/27/14 1513  BP: 146/99  Pulse: 85  Resp: 18  Height: 5' 10.5" (1.791 m)  Weight: 201 lb 6.4 oz (91.354 kg)   Body mass index is 28.48 kg/(m^2).  General: A&O x 3, WDWN  Head: [place]  Ear/Nose/Throat: Hearing grossly intact, nares w/o erythema or drainage, oropharynx w/o Erythema/Exudate  Eyes: PERRLA   Neck: Supple, no nuchal rigidity   Pulmonary: Sym exp, good air movt, CTAB, no rales, rhonchi, & wheezing  Cardiac: RRR  Vascular: Vessel Right Left  Radial Palpable Palpable  Brachial  Palpable Palpable  Carotid Palpable, without bruit Palpable, without bruit  Aorta Not palpable N/A  Femoral Palpable Palpable  Popliteal Not palpable Not palpable  PT Palpable Palpable  DP Palpable Palpable   Gastrointestinal: soft, NTND  peritoneal dialysis catheter lower abdomin    Musculoskeletal: M/S 5/5 throughout,  Extremities without ischemic changes   Neurologic: CN 2-12 intact , Pain and light touch intact in extremities, Motor exam as listed above  Psychiatric: Judgment intact, Mood & affect appropriatefor pt's clinical situation   Dermatologic: See M/S exam for extremity exam, no rashes otherwise noted  Lymph : No Cervical, Axillary, or Inguinal lymphadenopathy   Non-Invasive Vascular Imaging  ABI (Date: 08/27/2014)  R: triphasic flow 1.0  L: triphasic flow 1.0    Medical Decision Making  Lawrence Mckenzie is a 38 y.o. male who presents with: bilateral leg "heaviness."    He has normal triphasic blood flow and palpable DP/PT.  He has gotten some relief of his tingling and aching with the neurontin he is taking.  He will follow up with us as needed in the future.  He was seen today by Dr. Arbie CookeyEarly in clinic  [place]OLLINS, [facility] LPEMMA [facility]trMAUREEN PA-C [facility] Office: [phone]   08/27/2014, 3:51 PM   I have examined the patient, reviewed and agree  with above. Agree with above. Patient has no evidence of arterial insufficiency to cause his leg discomfort. He was reassured and will see [place] again on as-needed basis  EARLY, TODD, MD 08/27/2014 4:13 PM

## 2016-01-20 ENCOUNTER — Ambulatory Visit (INDEPENDENT_AMBULATORY_CARE_PROVIDER_SITE_OTHER): Payer: Managed Care, Other (non HMO) | Admitting: Family Medicine

## 2016-01-20 ENCOUNTER — Ambulatory Visit (INDEPENDENT_AMBULATORY_CARE_PROVIDER_SITE_OTHER): Payer: Managed Care, Other (non HMO)

## 2016-01-20 VITALS — BP 122/90 | HR 97 | Temp 97.8°F | Resp 18 | Ht 70.87 in | Wt 207.6 lb

## 2016-01-20 DIAGNOSIS — R519 Headache, unspecified: Secondary | ICD-10-CM

## 2016-01-20 DIAGNOSIS — M79672 Pain in left foot: Secondary | ICD-10-CM

## 2016-01-20 DIAGNOSIS — S9032XA Contusion of left foot, initial encounter: Secondary | ICD-10-CM | POA: Diagnosis not present

## 2016-01-20 DIAGNOSIS — R51 Headache: Secondary | ICD-10-CM | POA: Diagnosis not present

## 2016-01-20 MED ORDER — TRAMADOL HCL 50 MG PO TABS: 50.0000 mg | ORAL_TABLET | Freq: Three times a day (TID) | ORAL | Status: DC | PRN

## 2016-01-20 MED ORDER — METAXALONE 800 MG PO TABS: ORAL_TABLET | ORAL | Status: DC

## 2016-01-20 NOTE — Patient Instructions (Addendum)
Take tramadol 1 every 6 or 8 hours as needed for headache or foot pain  Continue to use Tylenol 2 pills 3 times daily as needed for pain  Try Skelaxin (metaxalone) 13 times daily if needed for bad headache also in addition to the above.  Apply ice to the foot several times daily for about 15 minutes  Return if worse or if necessary we can refer you to the headache pain specialist     Because you received an x-ray today, you will receive an invoice from [facility]. Please contact [facility] at [phone] with questions or concerns regarding your invoice. Our billing staff will not be able to assist you with those questions.

## 2016-01-20 NOTE — Progress Notes (Signed)
Patient ID: Lawrence Mckenzie, male    DOB: 09-29-1976  Age: 40 y.o. MRN: [id number]  Chief Complaint  Patient presents with  . Foot Injury    x ray   . Headache    Needs meds    Subjective:   Patient was walking through the house in the dark at night and hit his foot on a chair. He has had a lot of pain in the lateral aspect of the left foot the last couple of days. He has had a hard time standing on his enough to do his work. He operates a forklift, sometimes seated, sometimes standing. He wears steel toed boots at work. This was a home injury.  He has also been having headaches in the right side of his head. He has a history of a renal transplant last year, and it may be from the medications but he is had recurrent headaches every few days in the right temporal and parietal area. He has no nausea or vomiting. He has tried sumatriptan and that he was given elsewhere and that did not help.  Current allergies, medications, problem list, past/family and social histories reviewed.  Objective:  BP 122/90 mmHg  Pulse 97  Temp(Src) 97.8 F (36.6 C) (Oral)  Resp 18  Ht 5' 10.87" (1.8 m)  Wt 207 lb 9.6 oz (94.167 kg)  BMI 29.06 kg/m2  SpO2 99%  No major acute distress. TMs normal. Eyes PERRLA. EOMs intact. Fundi benign. Neck supple without thyromegaly. Chest clear. Heart regular without murmurs. Tender left fourth toe primarily  Assessment & Plan:   Assessment: 1. Foot pain, left   2. Nonintractable episodic headache, unspecified headache type   3. Foot contusion, left, initial encounter       Plan: X-ray foot  Foot x-ray is negative and radiologist does not see any injury either. See instructions  Orders Placed This Encounter  Procedures  . DG Foot Complete Left    Order Specific Question:  Reason for Exam (SYMPTOM  OR DIAGNOSIS REQUIRED)    Answer:  pain left 4th metatarsal and phalynx.  Stubbed foot in night.    Order Specific Question:  Preferred imaging location?   Answer:  External    Meds ordered this encounter  Medications  . traMADol (ULTRAM) 50 MG tablet    Sig: Take 1 tablet (50 mg total) by mouth every 8 (eight) hours as needed for moderate pain.    Dispense:  20 tablet    Refill:  0  . metaxalone (SKELAXIN) 800 MG tablet    Sig: Take 1 pill 3 times daily if needed for headache    Dispense:  20 tablet    Refill:  0         Patient Instructions  Take tramadol 1 every 6 or 8 hours as needed for headache or foot pain  Continue to use Tylenol 2 pills 3 times daily as needed for pain  Try Skelaxin (metaxalone) 13 times daily if needed for bad headache also in addition to the above.  Apply ice to the foot several times daily for about 15 minutes  Return if worse or if necessary we can refer you to the headache pain specialist     Because you received an x-ray today, you will receive an invoice from [facility]. Please contact [facility] at [phone] with questions or concerns regarding your invoice. Our billing staff will not be able to assist you with those questions.      Return if symptoms  worsen or fail to improve.   Deloyce Walthers, MD 01/20/2016

## 2016-02-06 ENCOUNTER — Ambulatory Visit (INDEPENDENT_AMBULATORY_CARE_PROVIDER_SITE_OTHER): Payer: Managed Care, Other (non HMO) | Admitting: Family Medicine

## 2016-02-06 VITALS — BP 136/88 | HR 87 | Temp 98.6°F | Resp 16 | Ht 70.0 in | Wt 201.0 lb

## 2016-02-06 DIAGNOSIS — J111 Influenza due to unidentified influenza virus with other respiratory manifestations: Secondary | ICD-10-CM

## 2016-02-06 MED ORDER — HYDROCODONE-ACETAMINOPHEN 7.5-325 MG/15ML PO SOLN: 10.0000 mL | Freq: Four times a day (QID) | ORAL | Status: DC | PRN

## 2016-02-06 MED ORDER — OSELTAMIVIR PHOSPHATE 75 MG PO CAPS: 75.0000 mg | ORAL_CAPSULE | Freq: Two times a day (BID) | ORAL | Status: DC

## 2016-02-06 NOTE — Patient Instructions (Addendum)

## 2016-02-06 NOTE — Progress Notes (Signed)
40 yo kidney transplant patient who works [employer].  He is on Cellcept and other immunosuppressants and had a flu shot.  He saw nephrologist in last 48 hours and was told kidney function was good  The sore toe that was crushed a couple weeks ago is still painful when weightbearing after several hours. He wears steel-toed shoes at work.  He has 36 hours of myalgia, headache, fever,mild cough, mild sore throat, loss of appetite, and fatigue.  He has been taking tylenol, theraflu, and drinking lots of liquids.  Boss at work also very sick  Objective:  BP 136/88 mmHg  Pulse 87  Temp(Src) 98.6 F (37 C) (Oral)  Resp 16  Ht 5\' 10"  (1.778 m)  Wt 201 lb (91.173 kg)  BMI 28.84 kg/m2  SpO2 98% HEENT: unremarkable General:  Patient looks exhausted Chest: clear Heart: reg, no murmur Skin:  No rash Ext: no edema, left 4th toe is swollen, tender and markedly ecchymotic  CLINICAL DATA: Left foot injury 4 days ago. Persistent pain.  EXAM: LEFT FOOT - COMPLETE 3+ VIEW  COMPARISON: None.  FINDINGS: There is no evidence of fracture or dislocation. There is no evidence of arthropathy or other focal bone abnormality. Soft tissues are unremarkable.  IMPRESSION: Negative.   Electronically Signed  By: Amie Portlandavid Ormond M.D.  On: 01/20/2016 18:33  Assessment:  Influenza and left 4th toe fracture likely     ICD-9-CM ICD-10-CM   1. Influenza with respiratory manifestation 487.1 J11.1 oseltamivir (TAMIFLU) 75 MG capsule     HYDROcodone-acetaminophen (HYCET) 7.5-325 mg/15 ml solution     Signed, Elvina SidleKurt Kincaid Tiger, MD

## 2017-02-28 ENCOUNTER — Telehealth: Payer: Self-pay | Admitting: *Deleted

## 2017-02-28 ENCOUNTER — Ambulatory Visit (INDEPENDENT_AMBULATORY_CARE_PROVIDER_SITE_OTHER): Payer: Managed Care, Other (non HMO) | Admitting: Urgent Care

## 2017-02-28 VITALS — BP 133/89 | HR 72 | Temp 98.5°F | Resp 17 | Ht 70.0 in | Wt 211.0 lb

## 2017-02-28 DIAGNOSIS — R197 Diarrhea, unspecified: Secondary | ICD-10-CM | POA: Diagnosis not present

## 2017-02-28 DIAGNOSIS — R1084 Generalized abdominal pain: Secondary | ICD-10-CM | POA: Diagnosis not present

## 2017-02-28 DIAGNOSIS — J00 Acute nasopharyngitis [common cold]: Secondary | ICD-10-CM

## 2017-02-28 LAB — POCT CBC
GRANULOCYTE PERCENT: 65.6 % (ref 37–80)
HCT, POC: 36.1 % — AB (ref 43.5–53.7)
HEMOGLOBIN: 12.1 g/dL — AB (ref 14.1–18.1)
LYMPH, POC: 1.6 (ref 0.6–3.4)
MCH: 27.5 pg (ref 27–31.2)
MCHC: 33.5 g/dL (ref 31.8–35.4)
MCV: 82 fL (ref 80–97)
MID (cbc): 0.2 (ref 0–0.9)
MPV: 9.1 fL (ref 0–99.8)
POC GRANULOCYTE: 3.5 (ref 2–6.9)
POC LYMPH PERCENT: 31.1 %L (ref 10–50)
POC MID %: 3.3 %M (ref 0–12)
Platelet Count, POC: 256 10*3/uL (ref 142–424)
RBC: 4.39 M/uL — AB (ref 4.69–6.13)
RDW, POC: 13.8 %
WBC: 5.3 10*3/uL (ref 4.6–10.2)

## 2017-02-28 NOTE — Progress Notes (Signed)
MRN: [id number] DOB: 30-Dec-1975  Subjective:   Callan Norden is a 41 y.o. male presenting for chief complaint of Fever; Abdominal Pain (onset 2 days); Chills; and Cough  Reports 2 day history of 2 day history generalized belly pain, watery diarrhea (2 BM daily), subjective fever, chills, malaise. Has also had nasal congestion, runny nose, dry cough. Has tried APAP, soup, hydration with some improvement, admits feeling a little better today than the previous 2 days. Of note, patient admits that he has had multiple contacts at work with very similar symptoms. Denies sore throat, chest pain, shob, n/v, bloody stools. Denies smoking cigarettes. Patient had kidney transplant 2016.   Hridhaan has a current medication list which includes the following prescription(s): cyclosporine modified, gabapentin, labetalol, lisinopril, and omeprazole. Also has No Known Allergies. Joeseph  has a past medical history of Anemia; Chronic kidney disease; Hyperlipidemia; Hypertension; and PONV (postoperative nausea and vomiting). Also  has a past surgical history that includes Wisdom tooth extraction; CAPD insertion (N/A, 01/30/2014); and laparoscopy (N/A, 02/07/2014).  Objective:   Vitals: BP 133/89 (BP Location: Right Arm, Patient Position: Sitting, Cuff Size: Large)   Pulse 72   Temp 98.5 F (36.9 C) (Oral)   Resp 17   Ht  (1.778 m)   Wt 211 lb (95.7 kg)   SpO2 99%   BMI 30.28 kg/m   Physical Exam  Constitutional: He is oriented to person, place, and time. He appears well-developed and well-nourished.  HENT:  Mouth/Throat: Oropharynx is clear and moist.  Cardiovascular: Normal rate, regular rhythm and intact distal pulses.  Exam reveals no gallop and no friction rub.   No murmur heard. Pulmonary/Chest: No respiratory distress. He has no wheezes. He has no rales.  Abdominal: Soft. Bowel sounds are normal. He exhibits no distension and no mass. There is tenderness (mild generalized). There is no  guarding.  Neurological: He is alert and oriented to person, place, and time.  Skin: Skin is warm and dry.   Results for orders placed or performed in visit on 02/28/17 (from the past 24 hour(s))  Comprehensive metabolic panel     Status: Abnormal   Collection Time: 02/28/17  6:16 PM  Result Value Ref Range   Glucose 91 65 - 99 mg/dL   BUN 18 6 - 20 mg/dL   Creatinine, Ser 4.09 0.76 - 1.27 mg/dL   GFR calc non Af Amer 76 >59 mL/min/1.73   GFR calc Af Amer 88 >59 mL/min/1.73   BUN/Creatinine Ratio 15 9 - 20   Sodium 142 134 - 144 mmol/L   Potassium 5.3 (H) 3.5 - 5.2 mmol/L   Chloride 103 96 - 106 mmol/L   CO2 18 18 - 29 mmol/L   Calcium 9.7 8.7 - 10.2 mg/dL   Total Protein 7.8 6.0 - 8.5 g/dL   Albumin 4.2 3.5 - 5.5 g/dL   Globulin, Total 3.6 1.5 - 4.5 g/dL   Albumin/Globulin Ratio 1.2 1.2 - 2.2   Bilirubin Total 0.3 0.0 - 1.2 mg/dL   Alkaline Phosphatase 101 39 - 117 IU/L   AST 39 0 - 40 IU/L   ALT 11 0 - 44 IU/L   Narrative   Performed at:  [address] [place], [place]  [id number] Lab Director: Mila Homer MD, Phone:  [phone]  Specimen Status     Status: None (Preliminary result)   Collection Time: 02/28/17  6:16 PM  Result Value Ref Range   WBC WILL FOLLOW  RBC WILL FOLLOW    Hemoglobin WILL FOLLOW    Hematocrit WILL FOLLOW    MCV WILL FOLLOW    MCH WILL FOLLOW    MCHC WILL FOLLOW    RDW WILL FOLLOW    Platelets WILL FOLLOW    Neutrophils WILL FOLLOW    Lymphs WILL FOLLOW    Monocytes WILL FOLLOW    Eos WILL FOLLOW    Basos WILL FOLLOW    Neutrophils Absolute WILL FOLLOW    Lymphocytes Absolute WILL FOLLOW    Monocytes Absolute WILL FOLLOW    EOS (ABSOLUTE) WILL FOLLOW    Basophils Absolute WILL FOLLOW    Immature Granulocytes WILL FOLLOW    Immature Grans (Abs) WILL FOLLOW    Hematology Comments: WILL FOLLOW    Narrative   Performed at:  [address]  [address] [place], [place]  [id number] Lab Director:  Mila Homer MD, Phone:  [phone]  POCT CBC     Status: Abnormal   Collection Time: 02/28/17  6:38 PM  Result Value Ref Range   WBC 5.3 4.6 - 10.2 K/uL   Lymph, poc 1.6 0.6 - 3.4   POC LYMPH PERCENT 31.1 10 - 50 %L   MID (cbc) 0.2 0 - 0.9   POC MID % 3.3 0 - 12 %M   POC Granulocyte 3.5 2 - 6.9   Granulocyte percent 65.6 37 - 80 %G   RBC 4.39 (A) 4.69 - 6.13 M/uL   Hemoglobin 12.1 (A) 14.1 - 18.1 g/dL   HCT, POC 82.9 (A) 56.2 - 53.7 %   MCV 82.0 80 - 97 fL   MCH, POC 27.5 27 - 31.2 pg   MCHC 33.5 31.8 - 35.4 g/dL   RDW, POC 13.0 %   Platelet Count, POC 256 142 - 424 K/uL   MPV 9.1 0 - 99.8 fL   Assessment and Plan :   1. Generalized abdominal pain 2. Diarrhea, unspecified type - Recommended supportive care for now. Patient is high risk given immunosuppressant medications, return-to-clinic precautions discussed, patient verbalized understanding. Work note given.  - POCT CBC - Comprehensive metabolic panel - POCT urinalysis dipstick - Stool culture  3. Common cold - Supportive care recommended. RTC in 1 week if no improvement in symptoms.  Wallis Bamberg, PA-C [facility] [phone] 02/28/2017  6:04 PM

## 2017-02-28 NOTE — Telephone Encounter (Signed)
Faxed note to [employer] ATTN: Maxwell Caul. Confirmation page received 6:08 pm

## 2017-02-28 NOTE — Patient Instructions (Addendum)
Viral Gastroenteritis, Adult Viral gastroenteritis is also known as the stomach flu. This condition is caused by various viruses. These viruses can be passed from person to person very easily (are very contagious). This condition may affect your stomach, small intestine, and large intestine. It can cause sudden watery diarrhea, fever, and vomiting. Diarrhea and vomiting can make you feel weak and cause you to become dehydrated. You may not be able to keep fluids down. Dehydration can make you tired and thirsty, cause you to have a dry mouth, and decrease how often you urinate. Older adults and people with other diseases or a weak immune system are at higher risk for dehydration. It is important to replace the fluids that you lose from diarrhea and vomiting. If you become severely dehydrated, you may need to get fluids through an IV tube. What are the causes? Gastroenteritis is caused by various viruses, including rotavirus and norovirus. Norovirus is the most common cause in adults. You can get sick by eating food, drinking water, or touching a surface contaminated with one of these viruses. You can also get sick from sharing utensils or other personal items with an infected person. What increases the risk? This condition is more likely to develop in people:  Who have a weak defense system (immune system).  Who live with one or more children who are younger than 2 years old.  Who live in a nursing home.  Who go on cruise ships.  What are the signs or symptoms? Symptoms of this condition start suddenly 1-2 days after exposure to a virus. Symptoms may last a few days or as long as a week. The most common symptoms are watery diarrhea and vomiting. Other symptoms include:  Fever.  Headache.  Fatigue.  Pain in the abdomen.  Chills.  Weakness.  Nausea.  Muscle aches.  Loss of appetite.  How is this diagnosed? This condition is diagnosed with a medical history and physical exam. You  may also have a stool test to check for viruses or other infections. How is this treated? This condition typically goes away on its own. The focus of treatment is to restore lost fluids (rehydration). Your health care provider may recommend that you take an oral rehydration solution (ORS) to replace important salts and minerals (electrolytes) in your body. Severe cases of this condition may require giving fluids through an IV tube. Treatment may also include medicine to help with your symptoms. Follow these instructions at home: Follow instructions from your health care provider about how to care for yourself at home. Eating and drinking Follow these recommendations as told by your health care provider:  Take an ORS. This is a drink that is sold at pharmacies and retail stores.  Drink clear fluids in small amounts as you are able. Clear fluids include water, ice chips, diluted fruit juice, and low-calorie sports drinks.  Eat bland, easy-to-digest foods in small amounts as you are able. These foods include bananas, applesauce, rice, lean meats, toast, and crackers.  Avoid fluids that contain a lot of sugar or caffeine, such as energy drinks, sports drinks, and soda.  Avoid alcohol.  Avoid spicy or fatty foods.  General instructions   Drink enough fluid to keep your urine clear or pale yellow.  Wash your hands often. If soap and water are not available, use hand sanitizer.  Make sure that all people in your household wash their hands well and often.  Take over-the-counter and prescription medicines only as told by your health   care provider.  Rest at home while you recover.  Watch your condition for any changes.  Take a warm bath to relieve any burning or pain from frequent diarrhea episodes.  Keep all follow-up visits as told by your health care provider. This is important. Contact a health care provider if:  You cannot keep fluids down.  Your symptoms get worse.  You have  new symptoms.  You feel light-headed or dizzy.  You have muscle cramps. Get help right away if:  You have chest pain.  You feel extremely weak or you faint.  You see blood in your vomit.  Your vomit looks like coffee grounds.  You have bloody or black stools or stools that look like tar.  You have a severe headache, a stiff neck, or both.  You have a rash.  You have severe pain, cramping, or bloating in your abdomen.  You have trouble breathing or you are breathing very quickly.  Your heart is beating very quickly.  Your skin feels cold and clammy.  You feel confused.  You have pain when you urinate.  You have signs of dehydration, such as: ? Dark urine, very little urine, or no urine. ? Cracked lips. ? Dry mouth. ? Sunken eyes. ? Sleepiness. ? Weakness. This information is not intended to replace advice given to you by your health care provider. Make sure you discuss any questions you have with your health care provider. Document Released: 11/01/2005 Document Revised: 04/14/2016 Document Reviewed: 07/08/2015 Elsevier Interactive Patient Education  2017 [employer]vier Inc.     IF you received an x-ray today, you will receive an invoice from [facility]. Please contact [facility] at [phone] with questions or concerns regarding your invoice.   IF you received labwork today, you will receive an invoice from [facility]. Please contact [facility] at [phone] with questions or concerns regarding your invoice.   Our billing staff will not be able to assist you with questions regarding bills from these companies.  You will be contacted with the lab results as soon as they are available. The fastest way to get your results is to activate your My Chart account. Instructions are located on the last page of this paperwork. If you have not heard from us regarding the results in 2 weeks, please contact this office.      

## 2017-03-01 LAB — SPECIMEN STATUS

## 2017-03-03 LAB — COMPREHENSIVE METABOLIC PANEL
ALT: 11 IU/L (ref 0–44)
AST: 39 IU/L (ref 0–40)
Albumin/Globulin Ratio: 1.2 (ref 1.2–2.2)
Albumin: 4.2 g/dL (ref 3.5–5.5)
Alkaline Phosphatase: 101 IU/L (ref 39–117)
BILIRUBIN TOTAL: 0.3 mg/dL (ref 0.0–1.2)
BUN/Creatinine Ratio: 15 (ref 9–20)
BUN: 18 mg/dL (ref 6–20)
CALCIUM: 9.7 mg/dL (ref 8.7–10.2)
CHLORIDE: 103 mmol/L (ref 96–106)
CO2: 18 mmol/L (ref 18–29)
Creatinine, Ser: 1.2 mg/dL (ref 0.76–1.27)
GFR, EST AFRICAN AMERICAN: 88 mL/min/{1.73_m2} (ref >59)
GFR, EST NON AFRICAN AMERICAN: 76 mL/min/{1.73_m2} (ref >59)
GLUCOSE: 91 mg/dL (ref 65–99)
Globulin, Total: 3.6 g/dL (ref 1.5–4.5)
POTASSIUM: 5.3 mmol/L — AB (ref 3.5–5.2)
Sodium: 142 mmol/L (ref 134–144)
TOTAL PROTEIN: 7.8 g/dL (ref 6.0–8.5)

## 2017-03-08 LAB — CBC WITH DIFFERENTIAL/PLATELET
BASOS ABS: 0 10*3/uL (ref 0.0–0.2)
Basos: 1 %
EOS (ABSOLUTE): 0.2 10*3/uL (ref 0.0–0.4)
EOS: 4 %
HEMATOCRIT: 35.4 % — AB (ref 37.5–51.0)
Hemoglobin: 11.9 g/dL — ABNORMAL LOW (ref 13.0–17.7)
Immature Grans (Abs): 0 10*3/uL (ref 0.0–0.1)
Immature Granulocytes: 0 %
LYMPHS ABS: 1.3 10*3/uL (ref 0.7–3.1)
Lymphs: 23 %
MCH: 26.6 pg (ref 26.6–33.0)
MCHC: 33.6 g/dL (ref 31.5–35.7)
MCV: 79 fL (ref 79–97)
MONOCYTES: 8 %
Monocytes Absolute: 0.5 10*3/uL (ref 0.1–0.9)
NEUTROS ABS: 3.7 10*3/uL (ref 1.4–7.0)
NRBC: 0 % (ref 0–0)
Neutrophils: 64 %
PLATELETS: 278 10*3/uL (ref 150–379)
RBC: 4.47 x10E6/uL (ref 4.14–5.80)
RDW: 14.9 % (ref 12.3–15.4)
WBC: 5.8 10*3/uL (ref 3.4–10.8)

## 2017-03-10 ENCOUNTER — Ambulatory Visit: Payer: Managed Care, Other (non HMO) | Admitting: Urgent Care

## 2017-03-18 LAB — SPECIMEN STATUS REPORT

## 2017-03-21 ENCOUNTER — Ambulatory Visit: Payer: Managed Care, Other (non HMO) | Admitting: Urgent Care

## 2017-04-14 ENCOUNTER — Ambulatory Visit (HOSPITAL_COMMUNITY)
Admission: EM | Admit: 2017-04-14 | Discharge: 2017-04-15 | Disposition: A | Discharge status: Home / Self Care | Payer: 59 | Attending: Emergency Medicine | Admitting: Emergency Medicine

## 2017-04-14 DIAGNOSIS — S0181XA Laceration without foreign body of other part of head, initial encounter: Secondary | ICD-10-CM | POA: Diagnosis present

## 2017-04-14 DIAGNOSIS — S01512A Laceration without foreign body of oral cavity, initial encounter: Secondary | ICD-10-CM | POA: Diagnosis not present

## 2017-04-14 DIAGNOSIS — S0242XA Fracture of alveolus of maxilla, initial encounter for closed fracture: Secondary | ICD-10-CM | POA: Insufficient documentation

## 2017-04-14 DIAGNOSIS — S0121XA Laceration without foreign body of nose, initial encounter: Secondary | ICD-10-CM | POA: Insufficient documentation

## 2017-04-14 DIAGNOSIS — I10 Essential (primary) hypertension: Secondary | ICD-10-CM | POA: Insufficient documentation

## 2017-04-14 DIAGNOSIS — S31010A Laceration without foreign body of lower back and pelvis without penetration into retroperitoneum, initial encounter: Secondary | ICD-10-CM | POA: Diagnosis not present

## 2017-04-14 DIAGNOSIS — Z905 Acquired absence of kidney: Secondary | ICD-10-CM | POA: Diagnosis not present

## 2017-04-14 DIAGNOSIS — S01511A Laceration without foreign body of lip, initial encounter: Secondary | ICD-10-CM | POA: Diagnosis not present

## 2017-04-14 DIAGNOSIS — Z940 Kidney transplant status: Secondary | ICD-10-CM | POA: Insufficient documentation

## 2017-04-14 HISTORY — DX: Disorder of kidney and ureter, unspecified: N28.9

## 2017-04-14 LAB — I-STAT CHEM 8, ED
BUN: 23 mg/dL — AB (ref 6–20)
CALCIUM ION: 1.14 mmol/L — AB (ref 1.15–1.40)
CREATININE: 1.3 mg/dL — AB (ref 0.61–1.24)
Chloride: 107 mmol/L (ref 101–111)
Glucose, Bld: 98 mg/dL (ref 65–99)
HCT: 38 % — ABNORMAL LOW (ref 39.0–52.0)
Hemoglobin: 12.9 g/dL — ABNORMAL LOW (ref 13.0–17.0)
Potassium: 4.2 mmol/L (ref 3.5–5.1)
Sodium: 141 mmol/L (ref 135–145)
TCO2: 23 mmol/L (ref 0–100)

## 2017-04-14 LAB — I-STAT CG4 LACTIC ACID, ED: Lactic Acid, Venous: 0.69 mmol/L (ref 0.5–1.9)

## 2017-04-14 LAB — SAMPLE TO BLOOD BANK
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
## 2017-04-15 ENCOUNTER — Emergency Department (HOSPITAL_COMMUNITY): Payer: 59

## 2017-04-15 ENCOUNTER — Encounter (HOSPITAL_COMMUNITY)
Admission: EM | Disposition: A | Discharge status: Home / Self Care | Payer: Self-pay | Source: Home / Self Care | Attending: Emergency Medicine

## 2017-04-15 ENCOUNTER — Encounter (HOSPITAL_COMMUNITY): Payer: Self-pay | Admitting: Emergency Medicine

## 2017-04-15 ENCOUNTER — Emergency Department (HOSPITAL_COMMUNITY): Payer: 59 | Admitting: Anesthesiology

## 2017-04-15 HISTORY — PX: FACIAL LACERATION REPAIR: SHX6589

## 2017-04-15 LAB — COMPREHENSIVE METABOLIC PANEL
ALK PHOS: 77 U/L (ref 38–126)
ALT: 12 U/L — ABNORMAL LOW (ref 17–63)
ANION GAP: 6 (ref 5–15)
AST: 47 U/L — ABNORMAL HIGH (ref 15–41)
Albumin: 3.8 g/dL (ref 3.5–5.0)
BILIRUBIN TOTAL: 1.1 mg/dL (ref 0.3–1.2)
BUN: 19 mg/dL (ref 6–20)
CALCIUM: 9.4 mg/dL (ref 8.9–10.3)
CO2: 22 mmol/L (ref 22–32)
Chloride: 109 mmol/L (ref 101–111)
Creatinine, Ser: 1.31 mg/dL — ABNORMAL HIGH (ref 0.61–1.24)
Glucose, Bld: 99 mg/dL (ref 65–99)
Potassium: 4.7 mmol/L (ref 3.5–5.1)
Sodium: 137 mmol/L (ref 135–145)
TOTAL PROTEIN: 7.1 g/dL (ref 6.5–8.1)

## 2017-04-15 LAB — PROTIME-INR
INR: 0.97
PROTHROMBIN TIME: 12.9 s (ref 11.4–15.2)

## 2017-04-15 LAB — ETHANOL

## 2017-04-15 LAB — CBC
HCT: 38.4 % — ABNORMAL LOW (ref 39.0–52.0)
HEMOGLOBIN: 12.2 g/dL — AB (ref 13.0–17.0)
MCH: 26.3 pg (ref 26.0–34.0)
MCHC: 31.8 g/dL (ref 30.0–36.0)
MCV: 82.9 fL (ref 78.0–100.0)
Platelets: 166 10*3/uL (ref 150–400)
RBC: 4.63 MIL/uL (ref 4.22–5.81)
RDW: 13.9 % (ref 11.5–15.5)
WBC: 4.7 10*3/uL (ref 4.0–10.5)

## 2017-04-15 LAB — CDS SEROLOGY

## 2017-04-15 SURGERY — REPAIR, LACERATION, FACE
Anesthesia: General | Site: Face

## 2017-04-15 MED ORDER — HYDROMORPHONE HCL 1 MG/ML IJ SOLN
INTRAMUSCULAR | Status: AC
  Administered 2017-04-15: 0.5 mg via INTRAVENOUS
  Filled 2017-04-15: qty 1

## 2017-04-15 MED ORDER — CEPHALEXIN 500 MG PO CAPS: 500.0000 mg | ORAL_CAPSULE | Freq: Three times a day (TID) | ORAL | Status: DC

## 2017-04-15 MED ORDER — LACTATED RINGERS IV SOLN
INTRAVENOUS | Status: DC | PRN
  Administered 2017-04-15 (×2): via INTRAVENOUS

## 2017-04-15 MED ORDER — 0.9 % SODIUM CHLORIDE (POUR BTL) OPTIME
TOPICAL | Status: DC | PRN
  Administered 2017-04-15: 1000 mL

## 2017-04-15 MED ORDER — FENTANYL CITRATE (PF) 100 MCG/2ML IJ SOLN
100.0000 ug | Freq: Once | INTRAMUSCULAR | Status: AC
  Administered 2017-04-15: 100 ug via INTRAVENOUS
  Filled 2017-04-15: qty 2

## 2017-04-15 MED ORDER — SUGAMMADEX SODIUM 200 MG/2ML IV SOLN
INTRAVENOUS | Status: DC | PRN
  Administered 2017-04-15: 200 mg via INTRAVENOUS

## 2017-04-15 MED ORDER — HYDROMORPHONE HCL 1 MG/ML IJ SOLN
1.0000 mg | Freq: Once | INTRAMUSCULAR | Status: AC
  Administered 2017-04-15: 1 mg via INTRAVENOUS
  Filled 2017-04-15: qty 1

## 2017-04-15 MED ORDER — SUGAMMADEX SODIUM 200 MG/2ML IV SOLN
INTRAVENOUS | Status: AC
  Filled 2017-04-15: qty 2

## 2017-04-15 MED ORDER — SUCCINYLCHOLINE CHLORIDE 200 MG/10ML IV SOSY
PREFILLED_SYRINGE | INTRAVENOUS | Status: AC
  Filled 2017-04-15: qty 10

## 2017-04-15 MED ORDER — TETANUS-DIPHTH-ACELL PERTUSSIS 5-2.5-18.5 LF-MCG/0.5 IM SUSP
INTRAMUSCULAR | Status: AC
  Administered 2017-04-15: 0.5 mL via INTRAMUSCULAR
  Filled 2017-04-15: qty 0.5

## 2017-04-15 MED ORDER — PROPOFOL 10 MG/ML IV BOLUS
INTRAVENOUS | Status: AC
  Filled 2017-04-15: qty 20

## 2017-04-15 MED ORDER — MIDAZOLAM HCL 2 MG/2ML IJ SOLN
INTRAMUSCULAR | Status: DC | PRN
  Administered 2017-04-15: 2 mg via INTRAVENOUS

## 2017-04-15 MED ORDER — IOPAMIDOL (ISOVUE-300) INJECTION 61%
INTRAVENOUS | Status: AC
  Administered 2017-04-15: 100 mL
  Filled 2017-04-15: qty 100

## 2017-04-15 MED ORDER — LIDOCAINE HCL 2 % IJ SOLN
20.0000 mL | Freq: Once | INTRAMUSCULAR | Status: AC
  Administered 2017-04-15: 400 mg
  Filled 2017-04-15: qty 20

## 2017-04-15 MED ORDER — HYDROCODONE-ACETAMINOPHEN 5-325 MG PO TABS: 1.0000 | ORAL_TABLET | Freq: Four times a day (QID) | ORAL | 0 refills | Status: DC | PRN

## 2017-04-15 MED ORDER — LIDOCAINE 2% (20 MG/ML) 5 ML SYRINGE
INTRAMUSCULAR | Status: AC
  Filled 2017-04-15: qty 5

## 2017-04-15 MED ORDER — ROCURONIUM 10MG/ML (10ML) SYRINGE FOR MEDFUSION PUMP - OPTIME
INTRAVENOUS | Status: DC | PRN
  Administered 2017-04-15: 50 mg via INTRAVENOUS

## 2017-04-15 MED ORDER — CEPHALEXIN 500 MG PO CAPS: 500.0000 mg | ORAL_CAPSULE | Freq: Three times a day (TID) | ORAL | 0 refills | Status: DC

## 2017-04-15 MED ORDER — TETANUS-DIPHTHERIA TOXOIDS TD 5-2 LFU IM INJ
0.5000 mL | INJECTION | Freq: Once | INTRAMUSCULAR | Status: DC
  Filled 2017-04-15: qty 0.5

## 2017-04-15 MED ORDER — MIDAZOLAM HCL 2 MG/2ML IJ SOLN
INTRAMUSCULAR | Status: AC
  Filled 2017-04-15: qty 2

## 2017-04-15 MED ORDER — HYDROMORPHONE HCL 1 MG/ML IJ SOLN
0.2500 mg | INTRAMUSCULAR | Status: DC | PRN
  Administered 2017-04-15 (×2): 0.5 mg via INTRAVENOUS

## 2017-04-15 MED ORDER — ONDANSETRON HCL 4 MG/2ML IJ SOLN
INTRAMUSCULAR | Status: AC
  Filled 2017-04-15: qty 2

## 2017-04-15 MED ORDER — FENTANYL CITRATE (PF) 250 MCG/5ML IJ SOLN
INTRAMUSCULAR | Status: DC | PRN
  Administered 2017-04-15 (×2): 50 ug via INTRAVENOUS
  Administered 2017-04-15: 100 ug via INTRAVENOUS

## 2017-04-15 MED ORDER — LIDOCAINE HCL (CARDIAC) 20 MG/ML IV SOLN
INTRAVENOUS | Status: DC | PRN
Start: 2017-04-15 — End: 2017-04-15
  Administered 2017-04-15: 60 mg via INTRATRACHEAL

## 2017-04-15 MED ORDER — LIDOCAINE VISCOUS 2 % MT SOLN
15.0000 mL | Freq: Once | OROMUCOSAL | Status: AC
  Administered 2017-04-15: 15 mL via OROMUCOSAL
  Filled 2017-04-15: qty 15

## 2017-04-15 MED ORDER — ONDANSETRON HCL 4 MG/2ML IJ SOLN
INTRAMUSCULAR | Status: DC | PRN
  Administered 2017-04-15: 4 mg via INTRAVENOUS

## 2017-04-15 MED ORDER — CEFAZOLIN SODIUM-DEXTROSE 2-3 GM-% IV SOLR
INTRAVENOUS | Status: DC | PRN
  Administered 2017-04-15: 2 g via INTRAVENOUS

## 2017-04-15 MED ORDER — FENTANYL CITRATE (PF) 250 MCG/5ML IJ SOLN
INTRAMUSCULAR | Status: AC
Start: 2017-04-15 — End: 2017-04-15
  Filled 2017-04-15: qty 5

## 2017-04-15 MED ORDER — SUCCINYLCHOLINE 20MG/ML (10ML) SYRINGE FOR MEDFUSION PUMP - OPTIME
INTRAMUSCULAR | Status: DC | PRN
  Administered 2017-04-15: 100 mg via INTRAVENOUS

## 2017-04-15 MED ORDER — LABETALOL HCL 5 MG/ML IV SOLN
INTRAVENOUS | Status: AC
  Administered 2017-04-15: 5 mg via INTRAVENOUS
  Filled 2017-04-15: qty 4

## 2017-04-15 MED ORDER — PROPOFOL 10 MG/ML IV BOLUS
INTRAVENOUS | Status: DC | PRN
  Administered 2017-04-15: 130 mg via INTRAVENOUS

## 2017-04-15 MED ORDER — LABETALOL HCL 5 MG/ML IV SOLN
5.0000 mg | INTRAVENOUS | Status: DC | PRN
  Administered 2017-04-15 (×2): 5 mg via INTRAVENOUS

## 2017-04-15 SURGICAL SUPPLY — 38 items
ATTRACTOMAT 16X20 MAGNETIC DRP (DRAPES) IMPLANT
BLADE 15 SAFETY STRL DISP (BLADE) ×3 IMPLANT
BLADE SURG 10 STRL SS (BLADE) ×3 IMPLANT
BLADE SURG 15 STRL LF DISP TIS (BLADE) ×1 IMPLANT
BLADE SURG 15 STRL SS (BLADE) ×2
CANISTER SUCT 3000ML PPV (MISCELLANEOUS) ×3 IMPLANT
CLEANER TIP ELECTROSURG 2X2 (MISCELLANEOUS) ×3 IMPLANT
CONT SPEC 4OZ CLIKSEAL STRL BL (MISCELLANEOUS) ×3 IMPLANT
COVER SURGICAL LIGHT HANDLE (MISCELLANEOUS) ×3 IMPLANT
CRADLE DONUT ADULT HEAD (MISCELLANEOUS) IMPLANT
DRAIN CHANNEL 15F RND FF W/TCR (WOUND CARE) IMPLANT
DRAPE HALF SHEET 40X57 (DRAPES) ×3 IMPLANT
DRAPE INCISE 13X13 STRL (DRAPES) IMPLANT
ELECT COATED BLADE 2.86 ST (ELECTRODE) ×3 IMPLANT
ELECT REM PT RETURN 9FT ADLT (ELECTROSURGICAL) ×3
ELECTRODE REM PT RTRN 9FT ADLT (ELECTROSURGICAL) ×1 IMPLANT
EVACUATOR SILICONE 100CC (DRAIN) IMPLANT
GAUZE SPONGE 4X4 16PLY XRAY LF (GAUZE/BANDAGES/DRESSINGS) ×3 IMPLANT
GLOVE ECLIPSE 7.5 STRL STRAW (GLOVE) ×3 IMPLANT
GOWN STRL REUS W/ TWL LRG LVL3 (GOWN DISPOSABLE) ×2 IMPLANT
GOWN STRL REUS W/TWL LRG LVL3 (GOWN DISPOSABLE) ×4
KIT BASIN OR (CUSTOM PROCEDURE TRAY) ×3 IMPLANT
KIT ROOM TURNOVER OR (KITS) ×3 IMPLANT
NEEDLE HYPO 25GX1X1/2 BEV (NEEDLE) ×3 IMPLANT
NS IRRIG 1000ML POUR BTL (IV SOLUTION) ×3 IMPLANT
PAD ARMBOARD 7.5X6 YLW CONV (MISCELLANEOUS) ×6 IMPLANT
PENCIL FOOT CONTROL (ELECTRODE) ×3 IMPLANT
SPONGE INTESTINAL PEANUT (DISPOSABLE) ×3 IMPLANT
STAPLER VISISTAT 35W (STAPLE) IMPLANT
SUT CHROMIC 4 0 PS 2 18 (SUTURE) ×6 IMPLANT
SUT ETHILON 3 0 PS 1 (SUTURE) IMPLANT
SUT ETHILON 5 0 P 3 18 (SUTURE) ×2
SUT NYLON ETHILON 5-0 P-3 1X18 (SUTURE) ×1 IMPLANT
SUT SILK 2 0 FS (SUTURE) IMPLANT
SUT VIC AB 3-0 FS2 27 (SUTURE) ×6 IMPLANT
TOWEL OR 17X24 6PK STRL BLUE (TOWEL DISPOSABLE) ×3 IMPLANT
TRAY ENT MC OR (CUSTOM PROCEDURE TRAY) ×3 IMPLANT
WATER STERILE IRR 1000ML POUR (IV SOLUTION) ×3 IMPLANT
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
## 2017-04-15 NOTE — Anesthesia Procedure Notes (Signed)
Procedure Name: Intubation Date/Time: 04/15/2017 4:15 AM Performed by: Molli HazardGORDON, Caine Barfield M Pre-anesthesia Checklist: Patient identified, Emergency Drugs available, Suction available and Patient being monitored Patient Re-evaluated:Patient Re-evaluated prior to inductionOxygen Delivery Method: Circle system utilized Preoxygenation: Pre-oxygenation with 100% oxygen Intubation Type: IV induction, Rapid sequence and Cricoid Pressure applied Laryngoscope Size: Miller and 2 Grade View: Grade I Tube type: Oral Tube size: 7.5 mm Number of attempts: 1 Airway Equipment and Method: Stylet Placement Confirmation: ETT inserted through vocal cords under direct vision,  positive ETCO2 and breath sounds checked- equal and bilateral Secured at: 23 cm Tube secured with: Tape Dental Injury: Teeth and Oropharynx as per pre-operative assessment

## 2017-04-15 NOTE — ED Notes (Signed)
Pt returned from CT.  Pain meds given

## 2017-04-15 NOTE — ED Notes (Signed)
Pt's wife at bedside.
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
## 2017-04-15 NOTE — Anesthesia Postprocedure Evaluation (Signed)
Anesthesia Post Note  Patient: Lawrence Mckenzie  Procedure(s) Performed: Procedure(s) (LRB): FACIAL and TONGUE LACERATION REPAIR (N/A)     Patient location during evaluation: PACU Anesthesia Type: General Level of consciousness: awake and alert Pain management: pain level controlled Vital Signs Assessment: post-procedure vital signs reviewed and stable Respiratory status: spontaneous breathing, nonlabored ventilation and respiratory function stable Cardiovascular status: blood pressure returned to baseline and stable Postop Assessment: no signs of nausea or vomiting Anesthetic complications: no    Last Vitals:  Vitals:   04/15/17 0708 04/15/17 0713  BP: (!) 134/100 (!) 139/94  Pulse: 75   Resp:    Temp:      Last Pain:  Vitals:   04/15/17 0645  PainSc: Asleep                 Djon Tith,W. EDMOND

## 2017-04-15 NOTE — Transfer of Care (Signed)
Immediate Anesthesia Transfer of Care Note  Patient: Lawrence Mckenzie  Procedure(s) Performed: Procedure(s): FACIAL and TONGUE LACERATION REPAIR (N/A)  Patient Location: PACU  Anesthesia Type:General  Level of Consciousness: sedated, drowsy and patient cooperative  Airway & Oxygen Therapy: Patient connected to face mask oxygen  Post-op Assessment: Report given to RN, Post -op Vital signs reviewed and stable and Patient moving all extremities X 4  Post vital signs: Reviewed and stable  Last Vitals:  Vitals:   04/15/17 0200 04/15/17 0215  BP: 133/89 (!) 126/93  Pulse: 81 84  Resp: 13 15  Temp:      Last Pain:  Vitals:   04/15/17 0236  PainSc: 0-No pain         Complications: No apparent anesthesia complications

## 2017-04-15 NOTE — ED Notes (Signed)
Patient transported to X-ray 
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
## 2017-04-15 NOTE — Anesthesia Preprocedure Evaluation (Addendum)
Anesthesia Evaluation  Patient identified by MRN, date of birth, ID band Patient awake    Reviewed: Allergy & Precautions, H&P , NPO status , Patient's Chart, lab work & pertinent test results  Airway Mallampati: III  TM Distance: >3 FB Neck ROM: Full  Mouth opening: Limited Mouth Opening  Dental no notable dental hx. (+) Poor Dentition, Dental Advisory Given,    Pulmonary neg pulmonary ROS,    Pulmonary exam normal breath sounds clear to auscultation       Cardiovascular hypertension, Pt. on medications  Rhythm:Regular Rate:Normal     Neuro/Psych negative neurological ROS  negative psych ROS   GI/Hepatic negative GI ROS, Neg liver ROS,   Endo/Other  negative endocrine ROS  Renal/GU negative Renal ROSS/p renal transplant  negative genitourinary   Musculoskeletal   Abdominal   Peds  Hematology negative hematology ROS (+)   Anesthesia Other Findings   Reproductive/Obstetrics negative OB ROS                           Anesthesia Physical Anesthesia Plan  ASA: II and emergent  Anesthesia Plan: General   Post-op Pain Management:    Induction: Intravenous, Rapid sequence and Cricoid pressure planned  Airway Management Planned: Oral ETT and Video Laryngoscope Planned  Additional Equipment:   Intra-op Plan:   Post-operative Plan: Extubation in OR  Informed Consent: I have reviewed the patients History and Physical, chart, labs and discussed the procedure including the risks, benefits and alternatives for the proposed anesthesia with the patient or authorized representative who has indicated his/her understanding and acceptance.   Dental advisory given  Plan Discussed with: CRNA  Anesthesia Plan Comments:         Anesthesia Quick Evaluation

## 2017-04-15 NOTE — ED Notes (Signed)
ENT at the bedside

## 2017-04-15 NOTE — OR Nursing (Signed)
Pt pain is much better after IV medication. Pt refused to take PO pain med. Pt very sleepy again. Assisted back to bed. Anesthesia aware and would like pt to be more awake before discharge.
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
## 2017-04-15 NOTE — ED Notes (Signed)
Pt to CT at this time.

## 2017-04-16 ENCOUNTER — Encounter (HOSPITAL_COMMUNITY): Payer: Self-pay | Admitting: Otolaryngology

## 2017-04-29 ENCOUNTER — Ambulatory Visit (INDEPENDENT_AMBULATORY_CARE_PROVIDER_SITE_OTHER): Payer: 59 | Admitting: Family Medicine

## 2017-04-29 ENCOUNTER — Encounter: Payer: Self-pay | Admitting: Family Medicine

## 2017-04-29 VITALS — BP 130/89 | HR 77 | Temp 98.4°F | Resp 16 | Ht 70.0 in | Wt 204.8 lb

## 2017-04-29 DIAGNOSIS — M25512 Pain in left shoulder: Secondary | ICD-10-CM

## 2017-04-29 DIAGNOSIS — M62838 Other muscle spasm: Secondary | ICD-10-CM

## 2017-04-29 MED ORDER — TRAMADOL HCL 50 MG PO TABS: 50.0000 mg | ORAL_TABLET | Freq: Three times a day (TID) | ORAL | 0 refills | Status: AC | PRN
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
## 2017-04-29 NOTE — Patient Instructions (Addendum)
     IF you received an x-ray today, you will receive an invoice from [facility]. Please contact [facility] at [phone] with questions or concerns regarding your invoice.   IF you received labwork today, you will receive an invoice from [facility]. Please contact [facility] at [phone] with questions or concerns regarding your invoice.   Our billing staff will not be able to assist you with questions regarding bills from these companies.  You will be contacted with the lab results as soon as they are available. The fastest way to get your results is to activate your My Chart account. Instructions are located on the last page of this paperwork. If you have not heard from us regarding the results in 2 weeks, please contact this office.     Musculoskeletal Pain Musculoskeletal pain is muscle and bone aches and pains. This pain can occur in any part of the body. Follow these instructions at home:  Only take medicines for pain, discomfort, or fever as told by your health care provider.  You may continue all activities unless the activities cause more pain. When the pain lessens, slowly resume normal activities. Gradually increase the intensity and duration of the activities or exercise.  During periods of severe pain, bed rest may be helpful. Lie or sit in any position that is comfortable, but get out of bed and walk around at least every several hours.  If directed, put ice on the injured area. ? Put ice in a plastic bag. ? Place a towel between your skin and the bag. ? Leave the ice on for 20 minutes, 2-3 times a day. Contact a health care provider if:  Your pain is getting worse.  Your pain is not relieved with medicines.  You lose function in the area of the pain if the pain is in your arms, legs, or neck. This information is not intended to replace advice given to you by your health care provider. Make sure you discuss any questions you have with your health  care provider. Document Released: 11/01/2005 Document Revised: 04/13/2016 Document Reviewed: 07/06/2013 Elsevier Interactive Patient Education  2017 Elsevier Inc.  

## 2017-05-05 ENCOUNTER — Telehealth: Payer: Self-pay | Admitting: Family Medicine

## 2017-05-05 NOTE — Telephone Encounter (Signed)
Pt states that he went to the physical therapy and they would like him to be out of work another week.   Pt requesting a work note to return on July 2nd.  Contact number [phone]

## 2017-05-05 NOTE — Telephone Encounter (Signed)
Please advise 

## 2017-05-06 ENCOUNTER — Telehealth: Payer: Self-pay | Admitting: Family Medicine

## 2017-05-06 ENCOUNTER — Encounter: Payer: Self-pay | Admitting: Family Medicine

## 2017-05-06 NOTE — Telephone Encounter (Signed)
Pt is checking on the status of his out or work note and is stating that he needs to let them know today before 330 if he will be able to work  Pt will call back around 100 this afternoon  Best number [phone]

## 2017-05-06 NOTE — Telephone Encounter (Signed)
I see this letter was generated today. Did the patient pick up letter ? Can I close encounter ?

## 2017-05-06 NOTE — Telephone Encounter (Signed)
Letter given to patient.

## 2017-05-13 ENCOUNTER — Encounter: Payer: Self-pay | Admitting: Family Medicine

## 2017-05-26 ENCOUNTER — Telehealth: Payer: Self-pay | Admitting: Family Medicine

## 2017-05-26 NOTE — Telephone Encounter (Signed)
Pt came in today about paperwork that was fax over on 05/10/17, was able to locate the paperwork in Dr. Noberto RetortStalling's box. Lawrence Mckenzie was able to sign paperwork and [phone]

## 2017-06-06 ENCOUNTER — Telehealth: Payer: Self-pay | Admitting: Family Medicine
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
## 2017-06-13 ENCOUNTER — Telehealth: Payer: Self-pay | Admitting: Family Medicine

## 2017-06-13 NOTE — Telephone Encounter (Signed)
Paperwork scanned and faxed to [employer] on 06/13/17
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
## 2017-06-14 NOTE — Telephone Encounter (Signed)
Forms completed and faxed to [employer] on 06/14/17

## 2017-08-02 ENCOUNTER — Ambulatory Visit (INDEPENDENT_AMBULATORY_CARE_PROVIDER_SITE_OTHER): Payer: 59 | Admitting: Physician Assistant

## 2017-08-02 ENCOUNTER — Encounter: Payer: Self-pay | Admitting: Physician Assistant

## 2017-08-02 VITALS — BP 125/83 | HR 75 | Temp 98.7°F | Resp 16 | Ht 69.29 in | Wt 204.4 lb

## 2017-08-02 DIAGNOSIS — Z13220 Encounter for screening for lipoid disorders: Secondary | ICD-10-CM | POA: Diagnosis not present

## 2017-08-02 DIAGNOSIS — Z125 Encounter for screening for malignant neoplasm of prostate: Secondary | ICD-10-CM | POA: Diagnosis not present

## 2017-08-02 DIAGNOSIS — Z130 Encounter for screening for diseases of the blood and blood-forming organs and certain disorders involving the immune mechanism: Secondary | ICD-10-CM

## 2017-08-02 DIAGNOSIS — Z131 Encounter for screening for diabetes mellitus: Secondary | ICD-10-CM | POA: Diagnosis not present

## 2017-08-02 DIAGNOSIS — Z0000 Encounter for general adult medical examination without abnormal findings: Secondary | ICD-10-CM

## 2017-08-02 LAB — GLUCOSE, POCT (MANUAL RESULT ENTRY): POC Glucose: 89 mg/dl (ref 70–99)

## 2017-08-02 NOTE — Patient Instructions (Addendum)

## 2017-08-02 NOTE — Progress Notes (Signed)
PRIMARY CARE AT [facility] [address], [place] [address] [phone]  Date:  08/02/2017   Name:  Lawrence Mckenzie   DOB:  May 20, 1976   MRN:  [id number]  PCP:  Annie Sable, MD    History of Present Illness:  Lawrence Mckenzie is a 41 y.o. male patient who presents to PCP with  Chief Complaint  Patient presents with  . Annual Exam    physical     DIET: he is eating chicken or fish.  Sometimes eats fastfoods and fried foods.  Eating vegetables.  He is attempting to lose weight for about 3 weeks. Water intake: 80-100oz.  Caffeine: 1 coffee/5 hour energy.    BM: normal.  No constipaitno or diarrhea.  nop blood or black stool  URINATION: no dysuria, hematuria, or frequency.   SLEEP: 6-7 hours per night.  3 hours and sleeping  SOCIAL ACTIVITY:  3 children.   No time for fun He is exercising 2 days per week.   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  10 years EtOH: 1 beer every 2-3 days.   Tobacco or vaping: none Illicit drug use: none  Patient Active Problem List   Diagnosis Date Noted  . Bilateral leg pain 08/27/2014  . Malnutrition of moderate degree (HCC) 03/02/2014  . ARF (acute renal failure) (HCC) 03/01/2014  . AKI (acute kidney injury) (HCC) 03/01/2014  . Chronic kidney disease (CKD), stage IV (severe) (HCC) 08/30/2013  . 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 Proteinuria 02/28/2013  . Hematuria 02/28/2013    Past Medical History:  Diagnosis Date  . Anemia   . Chronic kidney disease   . Hyperlipidemia   . Hypertension   . PONV (postoperative nausea and vomiting)   . Renal disorder     Past Surgical History:  Procedure Laterality Date  . CAPD INSERTION N/A 01/30/2014   Procedure: LAPAROSCOPIC INSERTION CONTINUOUS AMBULATORY PERITONEAL DIALYSIS CATHETER;  Surgeon: Axel Filler, MD;  Location: [facility] OR;  Service: General;  Laterality: N/A;  . FACIAL LACERATION REPAIR N/A 04/15/2017   Procedure: FACIAL and TONGUE LACERATION REPAIR;  Surgeon: Suzanna Obey, MD;   Location: [facility] OR;  Service: ENT;  Laterality: N/A;  . LAPAROSCOPY N/A 02/07/2014   Procedure: LAPAROSCOPY DIAGNOSTIC; REINSERTION OF CAPD ;  Surgeon: Axel Filler, MD;  Location: [facility] OR;  Service: General;  Laterality: N/A;  . NEPHRECTOMY TRANSPLANTED ORGAN    . WISDOM TOOTH EXTRACTION     Hx; of    Social History  Substance Use Topics  . Smoking status: Never Smoker  . Smokeless tobacco: Never Used  . Alcohol use No     Comment: 1 beer/week    Family History  Problem Relation Age of Onset  . Hypertension Mother   . Diabetes Mother   . Hyperlipidemia Mother   . Hypertension Father   . Hyperlipidemia Father   . Diabetes Sister   . Hypertension Sister   . Hypertension Brother     No Known Allergies  Medication list has been reviewed and updated.  Current Outpatient Prescriptions on File Prior to Visit  Medication Sig Dispense Refill  . cycloSPORINE modified (NEORAL) 25 MG capsule Take 175 mg by mouth 2 (two) times daily.    Marland Kitchen gabapentin (NEURONTIN) 300 MG capsule Take 600 mg by mouth 3 (three) times daily.    Marland Kitchen labetalol (NORMODYNE) 200 MG tablet Take 200 mg by mouth 2 (two) times daily.    Marland Kitchen lisinopril (PRINIVIL,ZESTRIL) 20 MG tablet Take 20 mg by mouth daily.    Marland Kitchen  magnesium oxide (MAG-OX) 400 MG tablet Take 800 mg by mouth 2 (two) times daily.    Marland Kitchen omeprazole (PRILOSEC) 20 MG capsule Take 20 mg by mouth daily.    Marland Kitchen topiramate (TOPAMAX) 50 MG tablet Take 50 mg by mouth daily as needed (for headache).    . traMADol (ULTRAM) 50 MG tablet Take 1 tablet (50 mg total) by mouth every 8 (eight) hours as needed. 30 tablet 0  . cycloSPORINE modified (NEORAL) 100 MG capsule Take 150 mg by mouth 2 (two) times daily.    Marland Kitchen gabapentin (NEURONTIN) 100 MG capsule Take 100 mg by mouth 2 (two) times daily.     No current facility-administered medications on file prior to visit.     ROS ROS otherwise unremarkable unless listed above.  Physical Examination: BP 125/83 (BP Location:  Right Arm, Patient Position: Sitting, Cuff Size: Normal)   Pulse 75   Temp 98.7 F (37.1 C) (Oral)   Resp 16   Ht 5' 9.29" (1.76 m)   Wt 204 lb 6.4 oz (92.7 kg)   SpO2 98%   BMI 29.93 kg/m  Ideal Body Weight: Weight in (lb) to have BMI = 25: 170.4  Physical Exam  Constitutional: He is oriented to person, place, and time. He appears well-developed and well-nourished. No distress.  HENT:  Head: Normocephalic and atraumatic.  Right Ear: Tympanic membrane, external ear and ear canal normal.  Left Ear: Tympanic membrane, external ear and ear canal normal.  Eyes: Pupils are equal, round, and reactive to light. Conjunctivae and EOM are normal.  Cardiovascular: Normal rate, regular rhythm and intact distal pulses.  Exam reveals no friction rub.   No murmur heard. Pulmonary/Chest: Effort normal. No respiratory distress. He has no wheezes.  Abdominal: Soft. Bowel sounds are normal. He exhibits no distension and no mass. There is no tenderness.  Musculoskeletal: Normal range of motion. He exhibits no edema or tenderness.  Neurological: He is alert and oriented to person, place, and time. He displays normal reflexes.  Skin: Skin is warm and dry. He is not diaphoretic.  Psychiatric: He has a normal mood and affect. His behavior is normal.     Assessment and Plan: Lawrence Mckenzie is a 41 y.o. male who is here today for cc of  Chief Complaint  Patient presents with  . Annual Exam    physical   Annual physical exam - Plan: Lipid panel, POCT glucose (manual entry), PSA, CBC  Screening for lipid disorders - Plan: Lipid panel, POCT glucose (manual entry)  Screening for diabetes mellitus - Plan: Lipid panel, POCT glucose (manual entry)  Screening for prostate cancer - Plan: PSA  Screening for deficiency anemia - Plan: CBC  Trena Platt, PA-C [facility] 9/23/20184:02 PM

## 2017-08-03 LAB — CBC
HEMOGLOBIN: 12.5 g/dL — AB (ref 13.0–17.7)
Hematocrit: 39 % (ref 37.5–51.0)
MCH: 26.2 pg — ABNORMAL LOW (ref 26.6–33.0)
MCHC: 32.1 g/dL (ref 31.5–35.7)
MCV: 82 fL (ref 79–97)
PLATELETS: 157 10*3/uL (ref 150–379)
RBC: 4.77 x10E6/uL (ref 4.14–5.80)
RDW: 14.8 % (ref 12.3–15.4)
WBC: 3 10*3/uL — ABNORMAL LOW (ref 3.4–10.8)

## 2017-08-03 LAB — LIPID PANEL
CHOL/HDL RATIO: 3.1 ratio (ref 0.0–5.0)
Cholesterol, Total: 163 mg/dL (ref 100–199)
HDL: 53 mg/dL (ref >39)
LDL CALC: 64 mg/dL (ref 0–99)
Triglycerides: 230 mg/dL — ABNORMAL HIGH (ref 0–149)
VLDL Cholesterol Cal: 46 mg/dL — ABNORMAL HIGH (ref 5–40)

## 2017-08-03 LAB — PSA: PROSTATE SPECIFIC AG, SERUM: 0.4 ng/mL (ref 0.0–4.0)

## 2017-11-03 ENCOUNTER — Encounter: Payer: Self-pay | Admitting: Physician Assistant

## 2017-11-03 DIAGNOSIS — Z940 Kidney transplant status: Secondary | ICD-10-CM | POA: Insufficient documentation

## 2017-11-03 DIAGNOSIS — G629 Polyneuropathy, unspecified: Secondary | ICD-10-CM | POA: Insufficient documentation

## 2017-12-20 ENCOUNTER — Ambulatory Visit: Payer: 59 | Admitting: Family Medicine

## 2018-02-13 ENCOUNTER — Encounter: Payer: Self-pay | Admitting: Physician Assistant
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
## 2018-08-07 ENCOUNTER — Ambulatory Visit (INDEPENDENT_AMBULATORY_CARE_PROVIDER_SITE_OTHER): Payer: 59 | Admitting: Family Medicine

## 2018-08-07 ENCOUNTER — Other Ambulatory Visit: Payer: Self-pay

## 2018-08-07 ENCOUNTER — Encounter: Payer: Self-pay | Admitting: Family Medicine

## 2018-08-07 VITALS — BP 115/76 | HR 65 | Temp 97.9°F | Resp 17 | Ht 69.0 in | Wt 210.0 lb

## 2018-08-07 DIAGNOSIS — Z131 Encounter for screening for diabetes mellitus: Secondary | ICD-10-CM | POA: Diagnosis not present

## 2018-08-07 DIAGNOSIS — E663 Overweight: Secondary | ICD-10-CM | POA: Diagnosis not present

## 2018-08-07 DIAGNOSIS — Z0001 Encounter for general adult medical examination with abnormal findings: Secondary | ICD-10-CM | POA: Diagnosis not present

## 2018-08-07 DIAGNOSIS — G629 Polyneuropathy, unspecified: Secondary | ICD-10-CM

## 2018-08-07 DIAGNOSIS — G43809 Other migraine, not intractable, without status migrainosus: Secondary | ICD-10-CM

## 2018-08-07 DIAGNOSIS — Z0000 Encounter for general adult medical examination without abnormal findings: Secondary | ICD-10-CM

## 2018-08-07 DIAGNOSIS — Z940 Kidney transplant status: Secondary | ICD-10-CM

## 2018-08-07 LAB — LIPID PANEL
Chol/HDL Ratio: 3.3 ratio (ref 0.0–5.0)
Cholesterol, Total: 173 mg/dL (ref 100–199)
HDL: 53 mg/dL (ref >39)
LDL CALC: 76 mg/dL (ref 0–99)
Triglycerides: 218 mg/dL — ABNORMAL HIGH (ref 0–149)
VLDL Cholesterol Cal: 44 mg/dL — ABNORMAL HIGH (ref 5–40)

## 2018-08-07 LAB — GLUCOSE, POCT (MANUAL RESULT ENTRY): POC GLUCOSE: 95 mg/dL (ref 70–99)

## 2018-08-07 NOTE — Patient Instructions (Addendum)
  As discussed, it is important that you pay attention to being physically, emotionally, relationally, and spiritually healthy.  Continue to take your medications and follow-up with your specialist.  Get your flu shot at the workplace  Return annually or as needed.   If you have lab work done today you will be contacted with your lab results within the next 2 weeks.  If you have not heard from us then please contact us. The fastest way to get your results is to register for My Chart.   IF you received an x-ray today, you will receive an invoice from [facility]. Please contact [facility] at [phone] with questions or concerns regarding your invoice.   IF you received labwork today, you will receive an invoice from [facility]. Please contact [facility] at [phone] with questions or concerns regarding your invoice.   Our billing staff will not be able to assist you with questions regarding bills from these companies.  You will be contacted with the lab results as soon as they are available. The fastest way to get your results is to activate your My Chart account. Instructions are located on the last page of this paperwork. If you have not heard from us regarding the results in 2 weeks, please contact this office.
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
## 2018-12-21 ENCOUNTER — Other Ambulatory Visit: Payer: Self-pay

## 2018-12-21 ENCOUNTER — Ambulatory Visit (INDEPENDENT_AMBULATORY_CARE_PROVIDER_SITE_OTHER): Payer: 59 | Admitting: Physician Assistant

## 2018-12-21 ENCOUNTER — Encounter: Payer: Self-pay | Admitting: Physician Assistant

## 2018-12-21 VITALS — BP 133/80 | HR 111 | Temp 101.0°F | Resp 18 | Ht 69.0 in | Wt 216.0 lb

## 2018-12-21 DIAGNOSIS — R509 Fever, unspecified: Secondary | ICD-10-CM

## 2018-12-21 DIAGNOSIS — R059 Cough, unspecified: Secondary | ICD-10-CM

## 2018-12-21 DIAGNOSIS — J111 Influenza due to unidentified influenza virus with other respiratory manifestations: Secondary | ICD-10-CM | POA: Diagnosis not present

## 2018-12-21 DIAGNOSIS — R05 Cough: Secondary | ICD-10-CM | POA: Diagnosis not present

## 2018-12-21 LAB — POCT INFLUENZA A/B
Influenza A, POC: POSITIVE — AB
Influenza B, POC: POSITIVE — AB

## 2018-12-21 MED ORDER — BENZONATATE 100 MG PO CAPS: 100.0000 mg | ORAL_CAPSULE | Freq: Three times a day (TID) | ORAL | 0 refills | Status: AC | PRN

## 2018-12-21 MED ORDER — HYDROCODONE-HOMATROPINE 5-1.5 MG/5ML PO SYRP: 5.0000 mL | ORAL_SOLUTION | Freq: Three times a day (TID) | ORAL | 0 refills | Status: AC | PRN

## 2018-12-21 NOTE — Progress Notes (Signed)
Lawrence Mckenzie  MRN: [id number] DOB: 1976-07-25  PCP: Annie Sable, MD  Subjective:  Pt presents to clinic for fever and fatigue x 3 days. Endorses bodyaches, chills and cough.  Temp at home have been 101.5. he is taking Tylenol, mucinex and otc flu medication.  No known sick contacts.  Review of Systems  Constitutional: Positive for chills and fever. Negative for diaphoresis and fatigue.  HENT: Positive for congestion. Negative for postnasal drip, rhinorrhea, sinus pressure, sinus pain, sneezing and sore throat.   Respiratory: Positive for cough. Negative for shortness of breath and wheezing.     Patient Active Problem List   Diagnosis Date Noted  . Peripheral neuropathy 15-Nov-2017  . Status post deceased-donor kidney transplantation 11-15-17  . Bilateral leg pain 08/27/2014  . Malnutrition of moderate degree (HCC) 03/02/2014  . ARF (acute renal failure) (HCC) 03/01/2014  . AKI (acute kidney injury) (HCC) 03/01/2014  . Chronic kidney disease (CKD), stage IV (severe) (HCC) 08/30/2013  . 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 Proteinuria 02/28/2013  . Hematuria 02/28/2013    Current Outpatient Medications on File Prior to Visit  Medication Sig Dispense Refill  . cycloSPORINE modified (NEORAL) 100 MG capsule Take 150 mg by mouth 2 (two) times daily.    . cycloSPORINE modified (NEORAL) 25 MG capsule Take 175 mg by mouth 2 (two) times daily.    Marland Kitchen gabapentin (NEURONTIN) 300 MG capsule Take 600 mg by mouth 3 (three) times daily.    Marland Kitchen labetalol (NORMODYNE) 200 MG tablet Take 200 mg by mouth 2 (two) times daily.    Marland Kitchen lisinopril (PRINIVIL,ZESTRIL) 20 MG tablet Take 20 mg by mouth daily.    . magnesium oxide (MAG-OX) 400 MG tablet Take 800 mg by mouth 2 (two) times daily.    Marland Kitchen omeprazole (PRILOSEC) 20 MG capsule Take 20 mg by mouth daily.    Marland Kitchen topiramate (TOPAMAX) 50 MG tablet Take 50 mg by mouth daily as needed (for headache).    . traMADol (ULTRAM) 50 MG tablet Take 1  tablet (50 mg total) by mouth every 8 (eight) hours as needed. 30 tablet 0   No current facility-administered medications on file prior to visit.     Allergies  Allergen Reactions  . Nsaids Other (See Comments)    Other     Objective:  BP 133/80   Pulse (!) 111   Temp (!) 101 F (38.3 C) (Oral)   Resp 18   Ht 5\' 9"  (1.753 m)   Wt 216 lb (98 kg)   SpO2 99%   BMI 31.90 kg/m   Physical Exam Vitals signs reviewed.  Constitutional:      General: He is not in acute distress. HENT:     Right Ear: Tympanic membrane normal.     Left Ear: Tympanic membrane normal.     Nose: Mucosal edema present. No rhinorrhea.     Right Sinus: No maxillary sinus tenderness or frontal sinus tenderness.     Left Sinus: No maxillary sinus tenderness or frontal sinus tenderness.  Cardiovascular:     Rate and Rhythm: Normal rate and regular rhythm.     Heart sounds: Normal heart sounds.  Pulmonary:     Effort: Pulmonary effort is normal. No respiratory distress.     Breath sounds: Normal breath sounds. No wheezing or rales.  Skin:    General: Skin is warm and dry.  Neurological:     Mental Status: He is alert and oriented to person, place, and time.  Psychiatric:  Judgment: Judgment normal.    Results for orders placed or performed in visit on 12/21/18  POCT Influenza A/B  Result Value Ref Range   Influenza A, POC Positive (A) Negative   Influenza B, POC Positive (A) Negative    Assessment and Plan :  1. Influenza - Pt presents c/o cough, chills and fever. He is positive for influenza A&B. Contact precautions discussed. Encouraged hydration. OOW note written. No tamiflu as he is >48 hours window. RTC if symptoms worsen or persist.   2. Fever, unspecified fever cause - POCT Influenza A/B  3. Cough - HYDROcodone-homatropine (HYCODAN) 5-1.5 MG/5ML syrup; Take 5 mLs by mouth every 8 (eight) hours as needed for cough.  Dispense: 120 mL; Refill: 0 - benzonatate (TESSALON) 100 MG  capsule; Take 1-2 capsules (100-200 mg total) by mouth 3 (three) times daily as needed for cough.  Dispense: 40 capsule; Refill: 0   Marco Collie, PA-C  [facility] at [facility] 12/21/2018 9:27 AM  Please note: Portions of this report may have been transcribed using dragon voice recognition software. Every effort was made to ensure accuracy; however, inadvertent computerized transcription errors may be present.

## 2018-12-21 NOTE — Patient Instructions (Addendum)
You are positive for influenza A&B. You are contagious. Please take precaution.   Stay well hydrated and be sure to eat small meals.  You should not go to work until you are fever-free for at least 24 hours.  Continue taking Tylenol for fever.   See below for home care tips:  Tessalon is for cough during the day. This should not make you drowsy.  Hycodan is to help your cough at night. This will make you drowsy. Do not take this with any other medications that make your drowsy.  Mucinex will help thin out your mucus to help you clear it out. Drink plenty of water while taking this medication.   Get lost of rest. Wash your hands often. Drink enough water and fluids to keep your urine clear or pale yellow.  -Foods that can help speed recovery: honey, garlic, chicken soup, elderberries, green tea.  -Supplements that can help speed recovery: vitamin C, zinc, elderberry extract, quercetin, ginseng, selenium -Supplement with prebiotics and probiotics.  For sore throat: ? Gargle with 8 oz of salt water ( tsp of salt per 1 qt of water) as often as every 1-2 hours to soothe your throat.  Gargle liquid benadryl.  Cepacol throat lozenges   For sore throat try using a honey-based tea. Use 3 teaspoons of honey with juice squeezed from half lemon. Place shaved pieces of ginger into 1/2-1 cup of water and warm over stove top. Then mix the ingredients and repeat every 4 hours as needed.  Cough Syrup Recipe: Sweet Lemon & Honey Thyme  Ingredients . a handful of fresh thyme sprigs   . 1 pint of water (2 cups)  . 1/2 cup honey (raw is best, but regular will do)  . 1/2 lemon chopped Instructions 1. Place the lemon in the pint jar and cover with the honey. The honey will macerate the lemons and draw out liquids which taste so delicious! 2. Meanwhile, toss the thyme leaves into a saucepan and cover them with the water. 3. Bring the water to a gentle simmer and reduce it to half, about a cup of  tea. 4. When the tea is reduced and cooled a bit, strain the sprigs & leaves, add it into the pint jar and stir it well. 5. Give it a shake and use a spoonful as needed. 6. Store your homemade cough syrup in the refrigerator for about a month.   Influenza, Adult Influenza, more commonly known as "the flu," is a viral infection that mainly affects the respiratory tract. The respiratory tract includes organs that help you breathe, such as the lungs, nose, and throat. The flu causes many symptoms similar to the common cold along with high fever and body aches. The flu spreads easily from person to person (is contagious). Getting a flu shot (influenza vaccination) every year is the best way to prevent the flu. What are the causes? This condition is caused by the influenza virus. You can get the virus by:  Breathing in droplets that are in the air from an infected person's cough or sneeze.  Touching something that has been exposed to the virus (has been contaminated) and then touching your mouth, nose, or eyes. What increases the risk? The following factors may make you more likely to get the flu:  Not washing or sanitizing your hands often.  Having close contact with many people during cold and flu season.  Touching your mouth, eyes, or nose without first washing or sanitizing your hands.  Not getting a yearly (annual) flu shot. You may have a higher risk for the flu, including serious problems such as a lung infection (pneumonia), if you:  Are older than 65.  Are pregnant.  Have a weakened disease-fighting system (immune system). You may have a weakened immune system if you: ? Have HIV or AIDS. ? Are undergoing chemotherapy. ? Are taking medicines that reduce (suppress) the activity of your immune system.  Have a long-term (chronic) illness, such as heart disease, kidney disease, diabetes, or lung disease.  Have a liver disorder.  Are severely overweight (morbidly obese).  Have  anemia. This is a condition that affects your red blood cells.  Have asthma. What are the signs or symptoms? Symptoms of this condition usually begin suddenly and last 4-14 days. They may include:  Fever and chills.  Headaches, body aches, or muscle aches.  Sore throat.  Cough.  Runny or stuffy (congested) nose.  Chest discomfort.  Poor appetite.  Weakness or fatigue.  Dizziness.  Nausea or vomiting. How is this diagnosed? This condition may be diagnosed based on:  Your symptoms and medical history.  A physical exam.  Swabbing your nose or throat and testing the fluid for the influenza virus. How is this treated? If the flu is diagnosed early, you can be treated with medicine that can help reduce how severe the illness is and how long it lasts (antiviral medicine). This may be given by mouth (orally) or through an IV. Taking care of yourself at home can help relieve symptoms. Your health care provider may recommend:  Taking over-the-counter medicines.  Drinking plenty of fluids. In many cases, the flu goes away on its own. If you have severe symptoms or complications, you may be treated in a hospital. Follow these instructions at home: Activity  Rest as needed and get plenty of sleep.  Stay home from work or school as told by your health care provider. Unless you are visiting your health care provider, avoid leaving home until your fever has been gone for 24 hours without taking medicine. Eating and drinking  Take an oral rehydration solution (ORS). This is a drink that is sold at pharmacies and retail stores.  Drink enough fluid to keep your urine pale yellow.  Drink clear fluids in small amounts as you are able. Clear fluids include water, ice chips, diluted fruit juice, and low-calorie sports drinks.  Eat bland, easy-to-digest foods in small amounts as you are able. These foods include bananas, applesauce, rice, lean meats, toast, and crackers.  Avoid  drinking fluids that contain a lot of sugar or caffeine, such as energy drinks, regular sports drinks, and soda.  Avoid alcohol.  Avoid spicy or fatty foods. General instructions      Take over-the-counter and prescription medicines only as told by your health care provider.  Use a cool mist humidifier to add humidity to the air in your home. This can make it easier to breathe.  Cover your mouth and nose when you cough or sneeze.  Wash your hands with soap and water often, especially after you cough or sneeze. If soap and water are not available, use alcohol-based hand sanitizer.  Keep all follow-up visits as told by your health care provider. This is important. How is this prevented?   Get an annual flu shot. You may get the flu shot in late summer, fall, or winter. Ask your health care provider when you should get your flu shot.  Avoid contact with people who  are sick during cold and flu season. This is generally fall and winter. Contact a health care provider if:  You develop new symptoms.  You have: ? Chest pain. ? Diarrhea. ? A fever.  Your cough gets worse.  You produce more mucus.  You feel nauseous or you vomit. Get help right away if:  You develop shortness of breath or difficulty breathing.  Your skin or nails turn a bluish color.  You have severe pain or stiffness in your neck.  You develop a sudden headache or sudden pain in your face or ear.  You cannot eat or drink without vomiting. Summary  Influenza, more commonly known as "the flu," is a viral infection that primarily affects your respiratory tract.  Symptoms of the flu usually begin suddenly and last 4-14 days.  Getting an annual flu shot is the best way to prevent getting the flu.  Stay home from work or school as told by your health care provider. Unless you are visiting your health care provider, avoid leaving home until your fever has been gone for 24 hours without taking  medicine.  Keep all follow-up visits as told by your health care provider. This is important. This information is not intended to replace advice given to you by your health care provider. Make sure you discuss any questions you have with your health care provider. Document Released: 10/29/2000 Document Revised: 04/19/2018 Document Reviewed: 04/19/2018 Elsevier Interactive Patient Education  2019 [employer]    IF you received an x-ray today, you will receive an invoice from [facility]. Please contact [facility] at [phone] with questions or concerns regarding your invoice.   IF you received labwork today, you will receive an invoice from [facility]. Please contact [facility] at [phone] with questions or concerns regarding your invoice.   Our billing staff will not be able to assist you with questions regarding bills from these companies.  You will be contacted with the lab results as soon as they are available. The fastest way to get your results is to activate your My Chart account. Instructions are located on the last page of this paperwork. If you have not heard from us regarding the results in 2 weeks, please contact this office.

## 2018-12-28 ENCOUNTER — Telehealth: Payer: Self-pay | Admitting: Physician Assistant

## 2018-12-28 NOTE — Telephone Encounter (Signed)
Copied from CRM [id number]. Topic: General - Other >> Dec 28, 2018  3:16 PM Tamela Oddi wrote: Reason for CRM: Patient called to inquire if the office received his FMLA paperwork from his employer.  Please call and let patient know if it was received.  CB# [phone]

## 2019-01-03 NOTE — Telephone Encounter (Signed)
Forms were completed and faxed on 12/27/18

## 2020-03-03 ENCOUNTER — Other Ambulatory Visit: Payer: Self-pay | Admitting: Nephrology

## 2020-03-04 ENCOUNTER — Other Ambulatory Visit: Payer: Self-pay | Admitting: Nephrology

## 2020-03-04 DIAGNOSIS — Z940 Kidney transplant status: Secondary | ICD-10-CM

## 2020-03-14 ENCOUNTER — Ambulatory Visit
Admission: RE | Admit: 2020-03-14 | Discharge: 2020-03-14 | Disposition: A | Discharge status: Home / Self Care | Payer: 59 | Source: Ambulatory Visit | Attending: Nephrology | Admitting: Nephrology

## 2020-03-14 DIAGNOSIS — Z940 Kidney transplant status: Secondary | ICD-10-CM

## 2021-05-14 ENCOUNTER — Other Ambulatory Visit: Payer: Self-pay | Admitting: Nephrology

## 2021-05-14 DIAGNOSIS — Z940 Kidney transplant status: Secondary | ICD-10-CM

## 2021-05-29 ENCOUNTER — Ambulatory Visit
Admission: RE | Admit: 2021-05-29 | Discharge: 2021-05-29 | Disposition: A | Discharge status: Home / Self Care | Payer: 59 | Source: Ambulatory Visit | Attending: Nephrology | Admitting: Nephrology

## 2021-05-29 DIAGNOSIS — Z940 Kidney transplant status: Secondary | ICD-10-CM

## 2021-08-20 ENCOUNTER — Encounter (HOSPITAL_COMMUNITY): Payer: Self-pay | Admitting: Emergency Medicine

## 2021-08-20 ENCOUNTER — Emergency Department (HOSPITAL_BASED_OUTPATIENT_CLINIC_OR_DEPARTMENT_OTHER)
Admission: EM | Admit: 2021-08-20 | Discharge: 2021-08-20 | Disposition: A | Discharge status: Home / Self Care | Payer: 59 | Attending: Emergency Medicine | Admitting: Emergency Medicine

## 2021-08-20 ENCOUNTER — Ambulatory Visit (HOSPITAL_COMMUNITY)
Admission: EM | Admit: 2021-08-20 | Discharge: 2021-08-20 | Disposition: A | Discharge status: Home / Self Care | Payer: 59

## 2021-08-20 ENCOUNTER — Encounter (HOSPITAL_BASED_OUTPATIENT_CLINIC_OR_DEPARTMENT_OTHER): Payer: Self-pay | Admitting: Emergency Medicine

## 2021-08-20 ENCOUNTER — Other Ambulatory Visit: Payer: Self-pay

## 2021-08-20 DIAGNOSIS — Z23 Encounter for immunization: Secondary | ICD-10-CM | POA: Diagnosis not present

## 2021-08-20 DIAGNOSIS — W228XXA Striking against or struck by other objects, initial encounter: Secondary | ICD-10-CM | POA: Insufficient documentation

## 2021-08-20 DIAGNOSIS — Z79899 Other long term (current) drug therapy: Secondary | ICD-10-CM | POA: Insufficient documentation

## 2021-08-20 DIAGNOSIS — Y9366 Activity, soccer: Secondary | ICD-10-CM | POA: Insufficient documentation

## 2021-08-20 DIAGNOSIS — S01511A Laceration without foreign body of lip, initial encounter: Secondary | ICD-10-CM

## 2021-08-20 DIAGNOSIS — I129 Hypertensive chronic kidney disease with stage 1 through stage 4 chronic kidney disease, or unspecified chronic kidney disease: Secondary | ICD-10-CM | POA: Diagnosis not present

## 2021-08-20 DIAGNOSIS — N189 Chronic kidney disease, unspecified: Secondary | ICD-10-CM | POA: Diagnosis not present

## 2021-08-20 DIAGNOSIS — S0993XA Unspecified injury of face, initial encounter: Secondary | ICD-10-CM | POA: Diagnosis present

## 2021-08-20 MED ORDER — TETANUS-DIPHTH-ACELL PERTUSSIS 5-2.5-18.5 LF-MCG/0.5 IM SUSY
0.5000 mL | PREFILLED_SYRINGE | Freq: Once | INTRAMUSCULAR | Status: AC
  Administered 2021-08-20: 0.5 mL via INTRAMUSCULAR
  Filled 2021-08-20: qty 0.5

## 2021-08-20 NOTE — ED Notes (Signed)
Pt d/c home with belongings.
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
## 2021-08-20 NOTE — Discharge Instructions (Addendum)
[facility]  [address], [place], [place] [address]
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
## 2022-05-11 ENCOUNTER — Encounter (HOSPITAL_COMMUNITY): Payer: Self-pay

## 2022-07-20 ENCOUNTER — Other Ambulatory Visit: Payer: Self-pay | Admitting: Nephrology

## 2022-07-20 DIAGNOSIS — Z940 Kidney transplant status: Secondary | ICD-10-CM

## 2022-08-06 ENCOUNTER — Ambulatory Visit
Admission: RE | Admit: 2022-08-06 | Discharge: 2022-08-06 | Disposition: A | Discharge status: Home / Self Care | Payer: 59 | Source: Ambulatory Visit | Attending: Nephrology | Admitting: Nephrology

## 2022-08-06 ENCOUNTER — Other Ambulatory Visit: Payer: Self-pay | Admitting: Nephrology

## 2022-08-06 DIAGNOSIS — Z09 Encounter for follow-up examination after completed treatment for conditions other than malignant neoplasm: Secondary | ICD-10-CM

## 2022-08-06 DIAGNOSIS — Z940 Kidney transplant status: Secondary | ICD-10-CM

## 2022-10-19 ENCOUNTER — Other Ambulatory Visit: Payer: Self-pay | Admitting: Nephrology

## 2022-10-19 DIAGNOSIS — Z114 Encounter for screening for human immunodeficiency virus [HIV]: Secondary | ICD-10-CM

## 2022-10-19 DIAGNOSIS — Z9483 Pancreas transplant status: Secondary | ICD-10-CM

## 2022-10-19 DIAGNOSIS — Z940 Kidney transplant status: Secondary | ICD-10-CM

## 2022-10-19 DIAGNOSIS — Z789 Other specified health status: Secondary | ICD-10-CM

## 2022-10-19 DIAGNOSIS — Z09 Encounter for follow-up examination after completed treatment for conditions other than malignant neoplasm: Secondary | ICD-10-CM

## 2022-10-19 DIAGNOSIS — N390 Urinary tract infection, site not specified: Secondary | ICD-10-CM

## 2022-10-19 DIAGNOSIS — E1129 Type 2 diabetes mellitus with other diabetic kidney complication: Secondary | ICD-10-CM

## 2022-10-19 DIAGNOSIS — E559 Vitamin D deficiency, unspecified: Secondary | ICD-10-CM

## 2022-10-19 DIAGNOSIS — T8610 Unspecified complication of kidney transplant: Secondary | ICD-10-CM

## 2022-10-19 DIAGNOSIS — D849 Immunodeficiency, unspecified: Secondary | ICD-10-CM

## 2022-10-19 DIAGNOSIS — B259 Cytomegaloviral disease, unspecified: Secondary | ICD-10-CM

## 2023-02-16 ENCOUNTER — Encounter (HOSPITAL_COMMUNITY): Payer: Self-pay

## 2023-09-23 ENCOUNTER — Telehealth: Payer: Self-pay | Admitting: Internal Medicine

## 2023-09-23 NOTE — Telephone Encounter (Signed)
Dr. Drue Novel has accepted him, okay to schedule at his convenience.

## 2023-09-23 NOTE — Telephone Encounter (Signed)
Pt asked to establish with paz after speaking with him during relative's appt today. Pt only wants to see paz. Please advise if pazwould accept as a np.

## 2023-09-30 ENCOUNTER — Telehealth: Payer: Self-pay | Admitting: Internal Medicine

## 2023-09-30 NOTE — Telephone Encounter (Signed)
Pt was contacted to schedule NP appointment with Drue Novel. Drue Novel has approved.LVM to give [place] a call back.
